# Patient Record
Sex: Male | Born: 1960
Health system: Southern US, Community
[De-identification: ages and names within clinical notes are randomized; demographics above are authoritative.]

## PROBLEM LIST (undated history)

## (undated) DIAGNOSIS — I2510 Atherosclerotic heart disease of native coronary artery without angina pectoris: Secondary | ICD-10-CM

## (undated) DIAGNOSIS — R740 Nonspecific elevation of levels of transaminase and lactic acid dehydrogenase [LDH]: Principal | ICD-10-CM

## (undated) DIAGNOSIS — H02839 Dermatochalasis of unspecified eye, unspecified eyelid: Secondary | ICD-10-CM

## (undated) DIAGNOSIS — K219 Gastro-esophageal reflux disease without esophagitis: Secondary | ICD-10-CM

## (undated) DIAGNOSIS — H43812 Vitreous degeneration, left eye: Secondary | ICD-10-CM

## (undated) DIAGNOSIS — E119 Type 2 diabetes mellitus without complications: Secondary | ICD-10-CM

## (undated) DIAGNOSIS — E785 Hyperlipidemia, unspecified: Secondary | ICD-10-CM

## (undated) DIAGNOSIS — H268 Other specified cataract: Secondary | ICD-10-CM

## (undated) DIAGNOSIS — I219 Acute myocardial infarction, unspecified: Secondary | ICD-10-CM

## (undated) DIAGNOSIS — I10 Essential (primary) hypertension: Secondary | ICD-10-CM

## (undated) DIAGNOSIS — H8110 Benign paroxysmal vertigo, unspecified ear: Secondary | ICD-10-CM

## (undated) DIAGNOSIS — T7840XA Allergy, unspecified, initial encounter: Secondary | ICD-10-CM

## (undated) HISTORY — DX: Nonspecific elevation of levels of transaminase and lactic acid dehydrogenase (ldh): R74.0

## (undated) HISTORY — DX: Benign paroxysmal vertigo, unspecified ear: H81.10

## (undated) HISTORY — DX: Other specified cataract: H26.8

## (undated) HISTORY — DX: Dermatochalasis of unspecified eye, unspecified eyelid: H02.839

## (undated) HISTORY — DX: Gastro-esophageal reflux disease without esophagitis: K21.9

## (undated) HISTORY — DX: Allergy, unspecified, initial encounter: T78.40XA

## (undated) HISTORY — DX: Type 2 diabetes mellitus without complications: E11.9

## (undated) HISTORY — DX: Vitreous degeneration, left eye: H43.812

## (undated) HISTORY — DX: Atherosclerotic heart disease of native coronary artery without angina pectoris: I25.10

---

## 2010-04-29 HISTORY — PX: CORONARY/GRAFT ACUTE MI REVASCULARIZATION: CATH118305

## 2012-08-17 DIAGNOSIS — J309 Allergic rhinitis, unspecified: Secondary | ICD-10-CM | POA: Insufficient documentation

## 2015-05-12 ENCOUNTER — Emergency Department (INDEPENDENT_AMBULATORY_CARE_PROVIDER_SITE_OTHER)
Admission: EM | Admit: 2015-05-12 | Discharge: 2015-05-12 | Disposition: A | Discharge status: Home / Self Care | Payer: Commercial Managed Care - PPO | Source: Home / Self Care | Attending: Family Medicine | Admitting: Family Medicine

## 2015-05-12 ENCOUNTER — Encounter: Payer: Self-pay | Admitting: *Deleted

## 2015-05-12 DIAGNOSIS — K219 Gastro-esophageal reflux disease without esophagitis: Secondary | ICD-10-CM | POA: Diagnosis not present

## 2015-05-12 DIAGNOSIS — R1013 Epigastric pain: Secondary | ICD-10-CM

## 2015-05-12 HISTORY — DX: Acute myocardial infarction, unspecified: I21.9

## 2015-05-12 HISTORY — DX: Essential (primary) hypertension: I10

## 2015-05-12 HISTORY — DX: Hyperlipidemia, unspecified: E78.5

## 2015-05-12 MED ORDER — OMEPRAZOLE 20 MG PO CPDR: 20.0000 mg | DELAYED_RELEASE_CAPSULE | Freq: Two times a day (BID) | ORAL | Status: DC

## 2015-05-12 NOTE — ED Provider Notes (Signed)
CSN: [id number]     Arrival date & time 05/12/15  1949 History   None    Chief Complaint  Patient presents with  . Abdominal Pain  . Nausea  . Chills   (Consider location/radiation/quality/duration/timing/severity/associated sxs/prior Treatment) HPI Pt is a 55yo male presenting to [facility] with c/o epigastric pain with associated nausea that is intermittent for 1 month. 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 This medication use to work but now his abdominal pain worsens by the end of the day.  Pain is a burning sensation.  Nothing seems to make it better or worse.  He does report having had an MI in the past too but states he just f/u with his cardiologist a few weeks ago and had a normal EKG and normal exam. He is scheduled for a stress test in a few weeks.  Denies fever, n/v/d. Denies chest pain or SOB. He has had mild rhinorrhea and chills but otherwise feels okay.   Past Medical History  Diagnosis Date  . Hypertension   . Hyperlipidemia   . Myocardial infarction [facility] [place])    History reviewed. No pertinent past surgical history. Family History  Problem Relation Age of Onset  . Diabetes Mother   . Heart disease Mother   . Kidney disease Mother    Social History  Substance Use Topics  . Smoking status: Never Smoker   . Smokeless tobacco: None  . Alcohol Use: No    Review of Systems  Constitutional: Negative for fever and chills.  HENT: Positive for congestion. Negative for ear pain, sore throat, trouble swallowing and voice change.   Respiratory: Negative for cough, shortness of breath and wheezing.   Cardiovascular: Negative for chest pain and palpitations.  Gastrointestinal: Positive for nausea and abdominal pain. Negative for vomiting and diarrhea.  Musculoskeletal: Negative for myalgias, back pain and arthralgias.  Skin: Negative for rash.    Allergies  Review of patient's allergies indicates no  known allergies.  Home Medications   Prior to Admission medications   Medication Sig Start Date End Date Taking? Authorizing Provider  acetaminophen (TYLENOL) 325 MG tablet Take 650 mg by mouth every 6 (six) hours as needed.   Yes Historical Provider, MD  aspirin 81 MG chewable tablet Chew by mouth daily.   Yes Historical Provider, MD  atorvastatin (LIPITOR) 80 MG tablet Take 80 mg by mouth daily.   Yes Historical Provider, MD  metoprolol tartrate (LOPRESSOR) 25 MG tablet Take 25 mg by mouth 2 (two) times daily.   Yes Historical Provider, MD  MULTIPLE VITAMIN PO Take by mouth.   Yes Historical Provider, MD  nitroGLYCERIN (NITROSTAT) 0.4 MG SL tablet Place 0.4 mg under the tongue every 5 (five) minutes as needed for chest pain.   Yes Historical Provider, MD  omeprazole (PRILOSEC) 20 MG capsule Take 20 mg by mouth daily.   Yes Historical Provider, MD  omeprazole (PRILOSEC) 20 MG capsule Take 1 capsule (20 mg total) by mouth 2 (two) times daily before a meal. 05/12/15   Junius Finner, PA-C   Meds Ordered and Administered this Visit  Medications - No data to display  BP 144/88 mmHg  Pulse 98  Temp(Src) 98.8 F (37.1 C) (Oral)  Resp 20  Ht 5\' 7"  (1.702 m)  Wt 189 lb (85.73 kg)  BMI 29.59 kg/m2  SpO2 98% No data found.   Physical Exam  Constitutional: He appears  well-developed and well-nourished. No distress.  HENT:  Head: Normocephalic and atraumatic.  Eyes: Conjunctivae are normal. No scleral icterus.  Neck: Normal range of motion. Neck supple.  Cardiovascular: Normal rate, regular rhythm and normal heart sounds.   Pulmonary/Chest: Effort normal and breath sounds normal. No respiratory distress. He has no wheezes. He has no rales. He exhibits no tenderness.  Abdominal: Soft. Bowel sounds are normal. He exhibits no distension and no mass. There is tenderness. There is no rebound and no guarding.  Soft, non-distended. Mild tenderness in epigastrium w/o rebound, guarding or masses.    Musculoskeletal: Normal range of motion.  Neurological: He is alert.  Skin: Skin is warm and dry. He is not diaphoretic.  Nursing note and vitals reviewed.   ED Course  Procedures (including critical care time)  Labs Review Labs Reviewed  COMPLETE METABOLIC PANEL WITH GFR  LIPASE  CBC WITH DIFFERENTIAL/PLATELET  POCT CBC W AUTO DIFF ([facility])    Imaging Review No results found.   MDM   1. Abdominal pain, epigastric   2. Gastroesophageal reflux disease, esophagitis presence not specified     Pt with hx of GERD c/o worsening epigastric pain and nausea for 1 month. Symptoms wax and wane.    Pt declined going to emergency department for further workup and declined EKG as he reports having a normal EKG just a few weeks ago with his cardiologist.  Denies chest pain or SOB.   Pt does have reproducible epigastric tenderness. Hx and exam c/w GERD.  Doubt cholecystitis but will get CBC, CMP and Lipase.    Pt advised to start taking his omeprazole twice daily and strongly encouraged to go to emergency department or call 911 if symptoms worsen including chest pain, shortness of breath or vomiting or other new concerning symptoms develop. Pt states he plans to go to [facility], his PCP tomorrow when they open to go over his new dose of medication as he is going to be out of town for work on Sunday. Patient verbalized understanding and agreement with treatment plan.       Junius Finnerrin O'Malley, PA-C 05/12/15 2025

## 2015-05-12 NOTE — ED Notes (Signed)
Pt c/o epigastric pain and nausea x 1 mth. He saw a dr in [facility] [place] and was told to take omeprazole. After taking it for 10 days he reports relief. Pain came back 05/01/15 Omeprazole is not helping. Hx of MI. He also c/o chills and stuffy nose x today.

## 2015-05-13 ENCOUNTER — Other Ambulatory Visit: Payer: Self-pay | Admitting: Emergency Medicine

## 2015-05-14 ENCOUNTER — Telehealth: Payer: Self-pay

## 2015-05-14 LAB — COMPLETE METABOLIC PANEL WITH GFR
ALT: 72 U/L — ABNORMAL HIGH (ref 9–46)
AST: 45 U/L — ABNORMAL HIGH (ref 10–35)
Albumin: 4 g/dL (ref 3.6–5.1)
Alkaline Phosphatase: 90 U/L (ref 40–115)
BUN: 19 mg/dL (ref 7–25)
CO2: 27 mmol/L (ref 20–31)
Calcium: 9.3 mg/dL (ref 8.6–10.3)
Chloride: 103 mmol/L (ref 98–110)
Creat: 0.84 mg/dL (ref 0.70–1.33)
GFR, Est African American: >89 mL/min (ref >=60)
GFR, Est Non African American: >89 mL/min (ref >=60)
Glucose, Bld: 121 mg/dL — ABNORMAL HIGH (ref 65–99)
Potassium: 4 mmol/L (ref 3.5–5.3)
Sodium: 139 mmol/L (ref 135–146)
Total Bilirubin: 0.6 mg/dL (ref 0.2–1.2)
Total Protein: 7.6 g/dL (ref 6.1–8.1)

## 2015-05-14 LAB — LIPASE: Lipase: 45 U/L (ref 7–60)

## 2015-05-22 ENCOUNTER — Ambulatory Visit: Payer: Commercial Managed Care - PPO | Admitting: Family Medicine

## 2015-05-26 ENCOUNTER — Ambulatory Visit (INDEPENDENT_AMBULATORY_CARE_PROVIDER_SITE_OTHER): Payer: Commercial Managed Care - PPO | Admitting: Family Medicine

## 2015-05-26 ENCOUNTER — Encounter: Payer: Self-pay | Admitting: Family Medicine

## 2015-05-26 VITALS — BP 106/68 | HR 78 | Ht 67.32 in | Wt 184.0 lb

## 2015-05-26 DIAGNOSIS — K2970 Gastritis, unspecified, without bleeding: Secondary | ICD-10-CM | POA: Diagnosis not present

## 2015-05-26 DIAGNOSIS — R748 Abnormal levels of other serum enzymes: Secondary | ICD-10-CM | POA: Diagnosis not present

## 2015-05-26 DIAGNOSIS — K2990 Gastroduodenitis, unspecified, without bleeding: Secondary | ICD-10-CM

## 2015-05-26 DIAGNOSIS — E785 Hyperlipidemia, unspecified: Secondary | ICD-10-CM | POA: Diagnosis not present

## 2015-05-26 DIAGNOSIS — I252 Old myocardial infarction: Secondary | ICD-10-CM

## 2015-05-26 MED ORDER — PANTOPRAZOLE SODIUM 40 MG PO TBEC: 40.0000 mg | DELAYED_RELEASE_TABLET | Freq: Every day | ORAL | Status: DC

## 2015-05-26 NOTE — Progress Notes (Signed)
CC: Zachary Harvey is a 55 y.o. male is here for Establish Care; Chest Pain; and Results   Subjective: HPI:   very pleasant 55 year old here to establish care.   He has a history of a myocardial infarction,  This occurred 2 years ago and he's been getting adequate follow-up with [facility]. In fact he had a stress test this morning which was normal and reassuring. He takes an aspirin, statin and beta blocker.   he has a history of hyperlipidemia  Currently taking max dose of  Atorvastatin. Denies right upper quadrant pain or myalgias   He had blood work done last week showing a mild elevation in liver function test. Additionally a mild elevation in blood sugar.   His major complaint today is epigastric pain that's been present for the past 2 or 3 weeks. It was improving with omeprazole however starting to worsen and he was seen next door at urgent care.  Lipase was normal. He had diarrhea the day after he was at urgent care but is back to having daily solid bowel movements. He denies any nausea or vomiting. Pain is worse when drinking soda or anything with acid. It's also been  Joined with the sensation of reflux up the back of his esophagus.  There is no exertional component to his discomfort. It's nonradiating.he had similar pain over 10 years ago that last a matter of months and required a colonoscopy and EGD both which were normal.  Review of Systems - General ROS: negative for - chills, fever, night sweats, weight gain or weight loss Ophthalmic ROS: negative for - decreased vision Psychological ROS: negative for - anxiety or depression ENT ROS: negative for - hearing change, nasal congestion, tinnitus or allergies Hematological and Lymphatic ROS: negative for - bleeding problems, bruising or swollen lymph nodes Breast ROS: negative Respiratory ROS: no cough, shortness of breath, or wheezing Cardiovascular ROS: no chest pain or dyspnea on exertion Gastrointestinal ROS:  no  change in bowel habits, or black or bloody stools Genito-Urinary ROS: negative for - genital discharge, genital ulcers, incontinence or abnormal bleeding from genitals Musculoskeletal ROS: negative for - joint pain or muscle pain Neurological ROS: negative for - headaches or memory loss Dermatological ROS: negative for lumps, mole changes, rash and skin lesion changes  Past Medical History  Diagnosis Date  . Hypertension   . Hyperlipidemia   . Myocardial infarction (HCC)     No past surgical history on file. Family History  Problem Relation Age of Onset  . Diabetes Mother   . Heart disease Mother   . Kidney disease Mother     Social History   Social History  . Marital Status: Married    Spouse Name: N/A  . Number of Children: N/A  . Years of Education: N/A   Occupational History  . Not on file.   Social History Main Topics  . Smoking status: Never Smoker   . Smokeless tobacco: Never Used  . Alcohol Use: No  . Drug Use: No  . Sexual Activity: Yes    Birth Control/ Protection: None   Other Topics Concern  . Not on file   Social History Narrative     Objective: BP 106/68 mmHg  Pulse 78  Ht 5' 7.32" (1.71 m)  Wt 184 lb (83.462 kg)  BMI 28.54 kg/m2  SpO2 94%  General: Alert and Oriented, No Acute Distress HEENT: Pupils equal, round, reactive to light. Conjunctivae clear. Moist mucous membranes Lungs: Clear to  auscultation bilaterally, no wheezing/ronchi/rales.  Comfortable work of breathing. Good air movement. Cardiac: Regular rate and rhythm. Normal S1/S2.  No murmurs, rubs, nor gallops.   Abdomen: mild obesity, nontender Extremities: No peripheral edema.  Strong peripheral pulses.  Mental Status: No depression, anxiety, nor agitation. Skin: Warm and dry.  Assessment & Plan: Antonyo was seen today for establish care, chest pain and results.  Diagnoses and all orders for this visit:  History of MI (myocardial infarction)  Hyperlipidemia  Elevated  liver enzymes  Gastritis and gastroduodenitis -     pantoprazole (PROTONIX) 40 MG tablet; Take 1 tablet (40 mg total) by mouth daily. To help prevent stomach pain.   Coronary artery disease with a recent normal stress test and adequate medical treatment with a statin, beta blocker and aspirin. Gastritis: chronic uncontrolled condition,Stop omeprazole switching to Protonix. If no better after 1 week call. Elevated liver enzymes: Plan on follow-up in 4 weeks to talk about repeating liver enzymes versus abdominal ultrasound.  Return in about 4 weeks (around 06/23/2015) for Stomach Pain.

## 2015-06-06 ENCOUNTER — Encounter: Payer: Self-pay | Admitting: Family Medicine

## 2015-06-06 ENCOUNTER — Ambulatory Visit (INDEPENDENT_AMBULATORY_CARE_PROVIDER_SITE_OTHER): Payer: Commercial Managed Care - PPO | Admitting: Family Medicine

## 2015-06-06 VITALS — BP 124/79 | HR 74 | Wt 185.0 lb

## 2015-06-06 DIAGNOSIS — H8112 Benign paroxysmal vertigo, left ear: Secondary | ICD-10-CM | POA: Diagnosis not present

## 2015-06-06 DIAGNOSIS — R42 Dizziness and giddiness: Secondary | ICD-10-CM | POA: Diagnosis not present

## 2015-06-06 DIAGNOSIS — R05 Cough: Secondary | ICD-10-CM | POA: Diagnosis not present

## 2015-06-06 DIAGNOSIS — R059 Cough, unspecified: Secondary | ICD-10-CM

## 2015-06-06 MED ORDER — AZITHROMYCIN 250 MG PO TABS: ORAL_TABLET | ORAL | Status: AC

## 2015-06-06 MED ORDER — DIAZEPAM 5 MG PO TABS: 2.5000 mg | ORAL_TABLET | Freq: Two times a day (BID) | ORAL | Status: DC | PRN

## 2015-06-06 NOTE — Progress Notes (Signed)
CC: Zachary Harvey is a 55 y.o. male is here for Hospitalization Follow-up   Subjective: HPI:  On Sunday this past week he started to develop a sensation of dizziness if he turns his head to the left or lies down suddenly. It was a 10 out of 10 on Sunday and has slowly reduced to a 5 out of 5 with any particular intervention. At rest when he stationary is nonexistent. No benefit from meclizine provided by urgent care on Sunday. He denies any hearing loss or tinnitus. Denies any head trauma or headache. He also developed a nonproductive cough and subjective chills on Sunday which is only mildly improved with over-the-counter cough and cold medication. He denies fever. He denies nausea or decreased appetite. Denies vision disturbance or any motor or sensory disturbances other than that described above. He denies any chest pain or shortness of breath.   Review Of Systems Outlined In HPI  Past Medical History  Diagnosis Date  . Hypertension   . Hyperlipidemia   . Myocardial infarction (HCC)     No past surgical history on file. Family History  Problem Relation Age of Onset  . Diabetes Mother   . Heart disease Mother   . Kidney disease Mother     Social History   Social History  . Marital Status: Married    Spouse Name: N/A  . Number of Children: N/A  . Years of Education: N/A   Occupational History  . Not on file.   Social History Main Topics  . Smoking status: Never Smoker   . Smokeless tobacco: Never Used  . Alcohol Use: No  . Drug Use: No  . Sexual Activity: Yes    Birth Control/ Protection: None   Other Topics Concern  . Not on file   Social History Narrative     Objective: BP 124/79 mmHg  Pulse 74  Wt 185 lb (83.915 kg)  General: Alert and Oriented, No Acute Distress HEENT: Pupils equal, round, reactive to light. Conjunctivae clear.  External ears unremarkable, canals clear with intact TMs with appropriate landmarks.  Middle ear appears open without effusion.  Pink inferior turbinates.  Moist mucous membranes, pharynx without inflammation nor lesions.  Neck supple without palpable lymphadenopathy nor abnormal masses. Lungs: Clear to auscultation bilaterally, no wheezing/ronchi/rales.  Comfortable work of breathing. Good air movement. Neuro: CN II-XII grossly intact, full strength/rom of all four extremities, C5/L4/S1 DTRs 2/4 bilaterally, gait normal, rapid alternating movements normal, heel-shin test normal, Rhomberg normal. No nystagmus. Symptoms reproduced with Dix-Hallpike to the left Cardiac: Regular rate and rhythm. Normal S1/S2.  No murmurs, rubs, nor gallops.   Extremities: No peripheral edema.  Strong peripheral pulses.  Mental Status: No depression, anxiety, nor agitation. Skin: Warm and dry.  Assessment & Plan: Zachary Harvey was seen today for hospitalization follow-up.  Diagnoses and all orders for this visit:  Dizziness  Cough -     azithromycin (ZITHROMAX) 250 MG tablet; Take two tabs at once on day 1, then one tab daily on days 2-5.  BPV (benign positional vertigo), left -     diazepam (VALIUM) 5 MG tablet; Take 0.5 tablets (2.5 mg total) by mouth every 12 (twelve) hours as needed (to prevent dizziness).   Classic symptoms of BPPV with no red flags. He is worried that he doesn't have the confidence to do the modified Epley maneuver due to fear of dizziness. I've given him a small dose of diazepam to use it once he starts to feel the sedative  effect he should start using the modified Epley maneuver under the guidance of his wife up to 3 times a day. Signs and symptoms requring emergent/urgent reevaluation were discussed with the patient. His cough is due to a respiratory illness that is most likely unrelated to his dizziness, low suspicion of intracranial abnormality nor vestibular neuritis given reproducibility of his symptoms   Return if symptoms worsen or fail to improve.

## 2015-06-12 ENCOUNTER — Ambulatory Visit (INDEPENDENT_AMBULATORY_CARE_PROVIDER_SITE_OTHER): Payer: Commercial Managed Care - PPO | Admitting: Family Medicine

## 2015-06-12 ENCOUNTER — Encounter: Payer: Self-pay | Admitting: Family Medicine

## 2015-06-12 VITALS — BP 129/80 | HR 69 | Wt 183.0 lb

## 2015-06-12 DIAGNOSIS — H538 Other visual disturbances: Secondary | ICD-10-CM | POA: Diagnosis not present

## 2015-06-12 DIAGNOSIS — H8112 Benign paroxysmal vertigo, left ear: Secondary | ICD-10-CM | POA: Diagnosis not present

## 2015-06-12 DIAGNOSIS — R42 Dizziness and giddiness: Secondary | ICD-10-CM | POA: Insufficient documentation

## 2015-06-12 DIAGNOSIS — H8110 Benign paroxysmal vertigo, unspecified ear: Secondary | ICD-10-CM | POA: Insufficient documentation

## 2015-06-12 NOTE — Progress Notes (Signed)
CC: Zachary Harvey is a 55 y.o. male is here for Work Release   Subjective: HPI:   Follow-up dizziness: He only ended up taking a few doses of Valium. He said it was helping with dizziness. He also noticed that the modified Epley maneuver made a big improvement with his dizziness. He's no longer having any lightheadedness or dizziness. Denies any other motor or sensory disturbances other than that described below.Marland Kitchen  He is wondering if he needs to go to classes. He occasionally noticed that he has some blurry vision bilaterally. He denies any ocular pain or discharge. He denies any itching of the eyes. Denies any tearing.    Review Of Systems Outlined In HPI  Past Medical History  Diagnosis Date  . Hypertension   . Hyperlipidemia   . Myocardial infarction (HCC)     No past surgical history on file. Family History  Problem Relation Age of Onset  . Diabetes Mother   . Heart disease Mother   . Kidney disease Mother     Social History   Social History  . Marital Status: Married    Spouse Name: N/A  . Number of Children: N/A  . Years of Education: N/A   Occupational History  . Not on file.   Social History Main Topics  . Smoking status: Never Smoker   . Smokeless tobacco: Never Used  . Alcohol Use: No  . Drug Use: No  . Sexual Activity: Yes    Birth Control/ Protection: None   Other Topics Concern  . Not on file   Social History Narrative     Objective: BP 129/80 mmHg  Pulse 69  Wt 183 lb (83.008 kg)  General: Alert and Oriented, No Acute Distress HEENT: Pupils equal, round, reactive to light. Conjunctivae clear.  Moist mucous membranes Lungs: Clear to auscultation bilaterally, no wheezing/ronchi/rales.  Comfortable work of breathing. Good air movement. Cardiac: Regular rate and rhythm. Normal S1/S2.  No murmurs, rubs, nor gallops.   Neuro: CN II-XII grossly intact, full strength/rom of all four extremities, C5/L4/S1 DTRs 2/4 bilaterally, gait normal, rapid  alternating movements normal, heel-shin test normal, Rhomberg normal. Extremities: No peripheral edema.  Strong peripheral pulses.  Mental Status: No depression, anxiety, nor agitation. Skin: Warm and dry.  Assessment & Plan: Zachary Harvey was seen today for work release.  Diagnoses and all orders for this visit:  BPV (benign positional vertigo), left  Blurred vision   BPV has now fully resolved,  I provided him with a letter today allowing him to go back to work without restrictions. Blurred vision: Vision by Snellen chart is reassuring, discussed following up with his ophthalmologist nonurgently.  Return if symptoms worsen or fail to improve.

## 2015-06-26 ENCOUNTER — Encounter: Payer: Self-pay | Admitting: Family Medicine

## 2015-06-26 DIAGNOSIS — H905 Unspecified sensorineural hearing loss: Secondary | ICD-10-CM | POA: Insufficient documentation

## 2015-06-26 DIAGNOSIS — H903 Sensorineural hearing loss, bilateral: Secondary | ICD-10-CM | POA: Insufficient documentation

## 2015-07-03 ENCOUNTER — Ambulatory Visit (INDEPENDENT_AMBULATORY_CARE_PROVIDER_SITE_OTHER): Payer: Commercial Managed Care - PPO | Admitting: Family Medicine

## 2015-07-03 ENCOUNTER — Encounter: Payer: Self-pay | Admitting: Family Medicine

## 2015-07-03 VITALS — BP 128/80 | HR 72 | Wt 186.0 lb

## 2015-07-03 DIAGNOSIS — R42 Dizziness and giddiness: Secondary | ICD-10-CM | POA: Diagnosis not present

## 2015-07-03 MED ORDER — AMOXICILLIN-POT CLAVULANATE 500-125 MG PO TABS: ORAL_TABLET | ORAL | Status: DC

## 2015-07-03 NOTE — Progress Notes (Signed)
CC: Zachary Harvey is a 55 y.o. male is here for Dizziness and Nausea   Subjective: HPI:   dizziness and nauseousness when leaning forward or lying down and looking to the right. Symptoms have been present for the past 2 days. There  Not getting any better or worse. No interventions as of yet. He denies any dizziness and any other situation. There is no fevers, chills nor vomiting. He denies any other sensory or motor disturbances.  He had  2 separate incidents of severe head trauma when he was younger but nothing recently. He denies any headache but does have some facial pressure behind the right eye in the forehead and nasal congestion that's been present for at least a week now.   Review Of Systems Outlined In HPI  Past Medical History  Diagnosis Date  . Hypertension   . Hyperlipidemia   . Myocardial infarction (HCC)     No past surgical history on file. Family History  Problem Relation Age of Onset  . Diabetes Mother   . Heart disease Mother   . Kidney disease Mother     Social History   Social History  . Marital Status: Married    Spouse Name: N/A  . Number of Children: N/A  . Years of Education: N/A   Occupational History  . Not on file.   Social History Main Topics  . Smoking status: Never Smoker   . Smokeless tobacco: Never Used  . Alcohol Use: No  . Drug Use: No  . Sexual Activity: Yes    Birth Control/ Protection: None   Other Topics Concern  . Not on file   Social History Narrative     Objective: BP 128/80 mmHg  Pulse 72  Wt 186 lb (84.369 kg)  General: Alert and Oriented, No Acute Distress HEENT: Pupils equal, round, reactive to light. Conjunctivae clear.  External ears unremarkable, canals clear with intact TMs with appropriate landmarks.   Left Middle ear appears open without effusion,  Right middle ear has a moderate serous effusion. Pink inferior turbinates.  Moist mucous membranes, pharynx without inflammation nor lesions.  Neck supple without  palpable lymphadenopathy nor abnormal masses. Lungs: Clear to auscultation bilaterally, no wheezing/ronchi/rales.  Comfortable work of breathing. Good air movement. Cardiac: Regular rate and rhythm. Normal S1/S2.  No murmurs, rubs, nor gallops.   No carotid bruit Extremities: No peripheral edema.  Strong peripheral pulses.  Mental Status: No depression, anxiety, nor agitation. Skin: Warm and dry.  Assessment & Plan: Zachary Harvey was seen today for dizziness and nausea.  Diagnoses and all orders for this visit:  Dizziness -     Ambulatory referral to Neurology -     amoxicillin-clavulanate (AUGMENTIN) 500-125 MG tablet; Take one by mouth every 8 hours for ten total days.    most like BPPV to the right therefore start  Modified Epley maneuver which  He already has information on.  Still possible this could be due to a sinus infection therefore start Augmentin as well. He would like reassurance that this is not something more significant than either of the above  Explanations  And would like a referral to a neurologist which seems reasonable.  No Follow-up on file.

## 2015-07-06 ENCOUNTER — Ambulatory Visit (INDEPENDENT_AMBULATORY_CARE_PROVIDER_SITE_OTHER): Payer: Commercial Managed Care - PPO | Admitting: Neurology

## 2015-07-06 ENCOUNTER — Encounter: Payer: Self-pay | Admitting: Neurology

## 2015-07-06 VITALS — BP 138/86 | HR 77 | Ht 67.0 in | Wt 188.4 lb

## 2015-07-06 DIAGNOSIS — H8113 Benign paroxysmal vertigo, bilateral: Secondary | ICD-10-CM | POA: Diagnosis not present

## 2015-07-06 NOTE — Patient Instructions (Signed)
-   will refer you to physical therapy to teach you maneuvers - you need to do it by yourself at home 5 times each and twice a day for one month. 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

## 2015-07-07 ENCOUNTER — Ambulatory Visit: Payer: Commercial Managed Care - PPO | Attending: Neurology | Admitting: Physical Therapy

## 2015-07-07 DIAGNOSIS — H8111 Benign paroxysmal vertigo, right ear: Secondary | ICD-10-CM | POA: Diagnosis present

## 2015-07-07 NOTE — Progress Notes (Signed)
NEUROLOGY CLINIC NEW PATIENT NOTE  NAME: Zachary Harvey DOB: 1960-11-28 REFERRING PHYSICIAN: Laren BoomHommel, Sean, DO  I saw Zachary Harvey as a new consult in the neurovascular clinic today regarding  Chief Complaint  Patient presents with  . New Patient (Initial Visit)    referral from  Dr. Laren BoomSean Hommel pts PCP for dizzy spells  .  HPI: Zachary Harvey is a 55 y.o. male with PMH of MI s/p stent 4 years ago and GERD who presents as a new patient for vertigo.   Pt stated that about 3 years ago, he turned over in bed to his right side, had episode of room spinning, lasting 15-2520min, he sat up taking Nitro for fearing of MI, called 911. On EMS arrival, he felt better, in ED had EKG negative, he was discharged from ED.   One month ago, he woke up and got up, suddenly had vertigo again, sever, with diarrhea, nausea but no vomiting. Real vertigo phase was brief but the nasuea and discomfort lasted whole day. He went to see PCP Dr. Gregary SignsSean and was diagnosed with BPPV and watched video for exercise maneuver. His condition getting much better.   Earlier this week, he got up in the morning, again had vertigo. Went to see PCP and referred her for further evaluation.  Pt has PMH of MI 4 years ago, s/p stent, on Nitro PRN and metoprolol, ASA and lipitor. He also has GERD on protonix. 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   He is a long distance truck driver, denies smoking, alcohol or illcit drugs.   Past Medical History  Diagnosis Date  . Hypertension   . Hyperlipidemia   . Myocardial infarction [facility](HCC)    History reviewed. No pertinent past surgical history. Family History  Problem Relation Age of Onset  . Diabetes Mother   . Heart disease Mother   . Kidney disease Mother    Current Outpatient Prescriptions  Medication Sig Dispense Refill  . acetaminophen (TYLENOL) 325 MG tablet Take 650 mg by mouth every 6 (six) hours as needed.      Marland Kitchen. aspirin 325 MG tablet Take 325 mg by mouth daily.    Marland Kitchen. atorvastatin (LIPITOR) 80 MG tablet Take 80 mg by mouth daily.    . Chlorphen-Pseudoephed-APAP (CORICIDIN D PO) Take by mouth.    . fluocinolone (SYNALAR) 0.01 % external solution Apply topically 2 (two) times daily. Takes 5 drops in right ear    . metoprolol tartrate (LOPRESSOR) 25 MG tablet Take 25 mg by mouth 2 (two) times daily.    . MULTIPLE VITAMIN PO Take by mouth.    . nitroGLYCERIN (NITROSTAT) 0.4 MG SL tablet Place 0.4 mg under the tongue every 5 (five) minutes as needed for chest pain.    . pantoprazole (PROTONIX) 40 MG tablet Take 1 tablet (40 mg total) by mouth daily. To help prevent stomach pain. 30 tablet 3   No current facility-administered medications for this visit.   No Known Allergies Social History   Social History  . Marital Status: Married    Spouse Name: N/A  . Number of Children: N/A  . Years of Education: N/A   Occupational History  . Not on file.   Social History Main Topics  . Smoking status: Never Smoker   . Smokeless tobacco: Never Used  . Alcohol Use: No  . Drug Use: No  . Sexual Activity: Yes    Birth Control/  Protection: None   Other Topics Concern  . Not on file   Social History Narrative    Review of Systems Full 14 system review of systems performed and notable only for those listed, all others are neg:  Constitutional:  fatigue Cardiovascular:  Ear/Nose/Throat:  Ringing in ears, spinning sensation Skin:  Eyes:  Blurry vision, eye pain Respiratory:   Gastroitestinal:  constipation Genitourinary:  Hematology/Lymphatic:   Endocrine:  Musculoskeletal:  Joint pain, aching muscles Allergy/Immunology:  allergies Neurological:  HA, weakness, dizziness Psychiatric: too much sleep Sleep:    Physical Exam  Filed Vitals:   07/06/15 0832  BP: 138/86  Pulse: 77    General - Well nourished, well developed, in no apparent distress.  Ophthalmologic - Sharp disc margins  OU.  Cardiovascular - Regular rate and rhythm with no murmur. Carotid pulses were 2+ without bruits .   Neck - supple, no nuchal rigidity.  Mental Status -  Level of arousal and orientation to time, place, and person were intact. Language including expression, naming, repetition, comprehension, reading, and writing was assessed and found intact. Attention span and concentration were normal. Fund of Knowledge was assessed and was intact.  Cranial Nerves II - XII - II - Visual field intact OU. III, IV, VI - Extraocular movements intact. V - Facial sensation intact bilaterally. VII - Facial movement intact bilaterally. VIII - Hearing & vestibular intact bilaterally. X - Palate elevates symmetrically. XI - Chin turning & shoulder shrug intact bilaterally. XII - Tongue protrusion intact.  Motor Strength - The patient's strength was normal in all extremities and pronator drift was absent.  Bulk was normal and fasciculations were absent.   Motor Tone - Muscle tone was assessed at the neck and appendages and was normal.  Reflexes - The patient's reflexes were normal in all extremities and he had no pathological reflexes.  Sensory - Light touch, temperature/pinprick were assessed and were normal.    Coordination - The patient had normal movements in the hands and feet with no ataxia or dysmetria.  Tremor was absent.  Gait and Station - The patient's transfers, posture, gait, station, and turns were observed as normal.  Positive Dix-Hallpike test - bilateral horizontal and right posterior canal involvement.    Imaging  I have personally reviewed the radiological images below and agree with the radiology interpretations.  none  Lab Review none    Assessment and Plan:   In summary, Zachary Harvey is a 55 y.o. male with PMH of MI s/p stent 4 years ago and GERD presents as new pt with intermittent vertigo on positional changes. Pt symptoms and exam consistent with BPPV at horizontal  and posterior canal level. He needs PT referral for maneuver training and constant self exercise at home. Although he had MI 4 years ago, he may have atherosclerosis with brain vasculature, but lack of central neurological deficit, will not need MRI or MRA at this time.   He is a long distance truck driver, however, the episode almost exclusively associated with head positional change on getting up from bed, or lying down onto bed or turning over in bed, we feel that he is still able to continue his driving but recommend limit head movement as he can. Of course, he should stop driving if feeling of dizziness occurs which he is aware. I also recommend that if he can change career or limited driving locally, he will think about it.   - urgent PT referral for maneuver training - you  maneuver exercise 5-10 times for one month at least. If vertigo recurs, need to do maneuver again for one month - follow up with your PCP - OK to continue driving with limiting head movement as possible - follow up in 1-2 months.  Thank you very much for the opportunity to participate in the care of this patient.  Please do not hesitate to call if any questions or concerns arise.  I spent more than 45 minutes of face to face time with the patient. Greater than 50% of time was spent in counseling and coordination of care. I taught him about maneuvers, watched video of maneuver training, and discussed about PT referral and safety issues.    Orders Placed This Encounter  Procedures  . Ambulatory referral to Physical Therapy    Referral Type:  Physical Medicine    Referral Reason:  Specialty Services Required    Requested Specialty:  Physical Therapy    Number of Visits Requested:  1    Meds ordered this encounter  Medications  . aspirin 325 MG tablet    Sig: Take 325 mg by mouth daily.  . fluocinolone (SYNALAR) 0.01 % external solution    Sig: Apply topically 2 (two) times daily. Takes 5 drops in right ear  .  Chlorphen-Pseudoephed-APAP (CORICIDIN D PO)    Sig: Take by mouth.    Patient Instructions  - will refer you to physical therapy to teach you maneuvers - you need to do it by yourself at home 5 times each and twice a day for one month. 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   Marvel Plan, MD PhD [facility] [address], [address] [place], [place] [address] [phone]

## 2015-07-07 NOTE — Therapy (Signed)
[facility] [address] [place], [place], [address] Phone: [phone]   Fax:  [phone]  Physical Therapy Evaluation  Patient Details  Name: Zachary Harvey MRN: [id number] Date of Birth: [birth year] Referring Provider: Marvel Plan  Encounter Date: 07/07/2015      PT End of Session - 07/07/15 1145    Visit Number 1   Number of Visits 4   Date for PT Re-Evaluation 08/04/15   PT Start Time 1100   PT Stop Time 1140   PT Time Calculation (min) 40 min   Activity Tolerance Patient tolerated treatment well   Behavior During Therapy [facility] for tasks assessed/performed      Past Medical History  Diagnosis Date  . Hypertension   . Hyperlipidemia   . Myocardial infarction (HCC)     No past surgical history on file.  There were no vitals filed for this visit.  Visit Diagnosis:  BPPV (benign paroxysmal positional vertigo), right - Plan: PT plan of care cert/re-cert      Subjective Assessment - 07/07/15 1103    Subjective Pt is a 55 y/o male who presents to OPPT with recent onset of vertigo.  Pt reports had one episode a few years ago.  Pt states 1 month hx of ne onest of vertigo; and symptoms especially in AM.  Went to MD and MD instructed pt to "look up videos on you tube."  Pt states this helped some but it has come back.     Patient Stated Goals improve dizziness   Currently in Pain? No/denies            [facility] PT Assessment - 07/07/15 1106    Assessment   Medical Diagnosis BPPV   Referring Provider XU, JINDONG   Onset Date/Surgical Date --  1 month   Next MD Visit 2 months   Prior Therapy none   Precautions   Precautions None   Restrictions   Weight Bearing Restrictions No   Balance Screen   Has the patient fallen in the past 6 months No   Has the patient had a decrease in activity level because of a fear of falling?  No   Is the patient reluctant to leave their home because of a fear of falling?  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            Vestibular Assessment - 07/07/15 1110    Symptom Behavior   Type of Dizziness Spinning   Frequency of Dizziness lying down and rolling over   Duration of Dizziness seconds   Aggravating Factors Lying supine;Rolling to right;Rolling to left;Turning body quickly;Turning head quickly   Relieving Factors Head stationary;Lying supine   Occulomotor Exam   Occulomotor Alignment Normal   Spontaneous Absent   Gaze-induced Absent   Smooth Pursuits Intact   Saccades Intact   Comment head thrust WNL   Vestibulo-Occular Reflex   VOR 1 Head Only (x 1 viewing) WNL   Positional Testing   Dix-Hallpike Dix-Hallpike Right;Dix-Hallpike Left   Horizontal Canal Testing Horizontal Canal Right;Horizontal Canal Left   Dix-Hallpike Right   Dix-Hallpike Right Duration 15 sec   Dix-Hallpike Right Symptoms Upbeat, right rotatory nystagmus   Dix-Hallpike Left   Dix-Hallpike Left Duration none   Dix-Hallpike Left Symptoms No nystagmus   Horizontal Canal Right  Horizontal Canal Right Duration 10-15 sec   Horizontal Canal Right Symptoms Nystagmus;Other (comment)  Right torsional upbeating   Horizontal Canal Left   Horizontal Canal Left Duration 10-15 seconds   Horizontal Canal Left Symptoms Other (comment);Nystagmus  unable to fully identify if nystagmus present and direction                Vestibular Treatment/Exercise - 07/07/15 1143    Vestibular Treatment/Exercise   Vestibular Treatment Provided Canalith Repositioning   Canalith Repositioning Epley Manuever Right   Habituation Exercises Horizontal Roll    EPLEY MANUEVER RIGHT   Number of Reps  2   Overall Response Symptoms Resolved   Horizontal Roll   Symptom Description  instructed in horizontal roll for HEP                PT Education - 07/07/15 1144    Education provided Yes   Education Details BPPV; HEP   Person(s) Educated Patient;Child(ren)   Methods Explanation;Demonstration;Handout   Comprehension Verbalized understanding;Returned demonstration;Need further instruction             PT Long Term Goals - 07/07/15 1148    PT LONG TERM GOAL #1   Title independent with HEP (08/04/15)   Time 4   Period Weeks   Status New   PT LONG TERM GOAL #2   Title demonstrate negative positional testing (08/04/15)   Time 4   Period Weeks   Status New               Plan - 07/07/15 1145    Clinical Impression Statement Pt is a 55 y/o male who presents to OPPT for low complexity evaluation for R posterior canal BPPV with possible horizontal canal involvement.  Pt demonstrates some symptoms and nystagmus with horizontal roll but feel nystagmus is torsional indication it may still only be isolated to R posterior canal.  Issued rolling for habituation for HEP and instructed to perform.  Pt verbalized understanding.  Pt will benefit from PT to decrease dizziness and return to full work activities.   Pt will benefit from skilled therapeutic intervention in order to improve on the following deficits Dizziness   Rehab Potential Good   PT Frequency 1x / week   PT Duration 4 weeks   PT Treatment/Interventions Canalith Repostioning;Therapeutic activities;Therapeutic exercise;Balance training;Neuromuscular re-education;Patient/family education;ADLs/Self Care Home Management;Vestibular   PT Next Visit Plan reassess; tx as indicated   Consulted and Agree with Plan of Care Patient         Problem List Patient Active Problem List   Diagnosis Date Noted  . SNHL (sensory-neural hearing loss), asymmetrical 06/26/2015  . BPV (benign positional vertigo) 06/12/2015  . History of MI (myocardial infarction) 05/26/2015  . Hyperlipidemia 05/26/2015  . Elevated liver enzymes 05/26/2015   Clarita CraneStephanie F Nilam Quakenbush, PT,  DPT 07/07/2015 11:51 AM  [facility] [address] [place], [place], [address] Phone: [phone]   Fax:  [phone]  Name: Leata MouseJose L Jake MRN: [id number] Date of Birth: 1960-11-22

## 2015-07-07 NOTE — Patient Instructions (Signed)
Sit to Side-Lying    Sit on edge of bed. 1. Turn head 45 to right. 2. Maintain head position and lie down quickly on left side. Hold until symptoms subside plus 30 seconds. 3. Sit up slowly. Hold until symptoms subside plus 30 seconds. 4. Turn head 45 to left. 5. Maintain head position and lie down quickly on right side. Hold until symptoms subside plus 30 seconds. 6. Sit up slowly. Repeat sequence __3-5__ times per session. Do _2-3___ sessions per day.  Copyright  VHI. All rights reserved.   Rolling    With pillow under head, start on back. Roll quickly to right. Hold position until symptoms subside plus 30 seconds.  Roll quickly onto left side. Hold position until symptoms subside plus 30 seconds. Repeat sequence __3-5__ times per session. Do __2-3__ sessions per day.  Copyright  VHI. All rights reserved.

## 2015-07-10 ENCOUNTER — Ambulatory Visit: Payer: Commercial Managed Care - PPO | Admitting: Physical Therapy

## 2015-07-10 ENCOUNTER — Encounter: Payer: Self-pay | Admitting: Family Medicine

## 2015-07-10 ENCOUNTER — Ambulatory Visit (INDEPENDENT_AMBULATORY_CARE_PROVIDER_SITE_OTHER): Payer: Commercial Managed Care - PPO | Admitting: Family Medicine

## 2015-07-10 VITALS — BP 128/85 | HR 71 | Wt 188.0 lb

## 2015-07-10 DIAGNOSIS — H8111 Benign paroxysmal vertigo, right ear: Secondary | ICD-10-CM | POA: Diagnosis not present

## 2015-07-10 DIAGNOSIS — K219 Gastro-esophageal reflux disease without esophagitis: Secondary | ICD-10-CM | POA: Diagnosis not present

## 2015-07-10 DIAGNOSIS — H8113 Benign paroxysmal vertigo, bilateral: Secondary | ICD-10-CM

## 2015-07-10 MED ORDER — SUCRALFATE 1 G PO TABS: 1.0000 g | ORAL_TABLET | Freq: Four times a day (QID) | ORAL | Status: DC | PRN

## 2015-07-10 NOTE — Therapy (Signed)
[facility] [address] [place], [place], [address] Phone: [phone]   Fax:  [phone]  Physical Therapy Treatment  Patient Details  Name: Zachary Harvey MRN: [id number] Date of Birth: [birth year] Referring Provider: Marvel Plan  Encounter Date: 07/10/2015      PT End of Session - 07/10/15 0919    Visit Number 2   Number of Visits 4   Date for PT Re-Evaluation 08/04/15   PT Start Time 0830   PT Stop Time 0859   PT Time Calculation (min) 29 min   Activity Tolerance Patient tolerated treatment well   Behavior During Therapy [facility] for tasks assessed/performed      Past Medical History  Diagnosis Date  . Hypertension   . Hyperlipidemia   . Myocardial infarction (HCC)     No past surgical history on file.  There were no vitals filed for this visit.  Visit Diagnosis:  BPPV (benign paroxysmal positional vertigo), right      Subjective Assessment - 07/10/15 0831    Subjective Felt good over the weekend; reports a little spinning this morning.  Only got 4 hours of sleep.   Patient Stated Goals improve dizziness   Currently in Pain? No/denies                Vestibular Assessment - 07/10/15 0832    Symptom Behavior   Type of Dizziness Spinning   Positional Testing   Dix-Hallpike Dix-Hallpike Right;Dix-Hallpike Left   Horizontal Canal Testing Horizontal Canal Right;Horizontal Canal Left   Dix-Hallpike Right   Dix-Hallpike Right Duration 15 sec   Dix-Hallpike Right Symptoms Upbeat, right rotatory nystagmus   Dix-Hallpike Left   Dix-Hallpike Left Duration none   Dix-Hallpike Left Symptoms No nystagmus   Horizontal Canal Right   Horizontal Canal Right Duration 5 sec   Horizontal Canal Right Symptoms Nystagmus;Other (comment)  R torsional upbeating   Horizontal Canal Left   Horizontal Canal Left Duration 3-5 seconds   Horizontal Canal Left Symptoms Other (comment);Nystagmus  L torsional                   Vestibular Treatment/Exercise - 07/10/15 0918    Vestibular Treatment/Exercise   Vestibular Treatment Provided Canalith Repositioning   Canalith Repositioning Epley Manuever Right;Canal Roll Right    EPLEY MANUEVER RIGHT   Number of Reps  2   Overall Response Symptoms Resolved   Response Details  head shaking performed before 2nd rep; no nystagmus   Canal Roll Right   Number of Reps  2   Overall Response  Symptoms Resolved   Response Details  no symptoms on 2nd rep               PT Education - 07/10/15 0920    Education provided Yes   Education Details continue HEP; discussed anatomy of canals clinical findings   Person(s) Educated Patient   Methods Explanation   Comprehension Verbalized understanding             PT Long Term Goals - 07/10/15 0922    PT LONG TERM GOAL #1   Title independent with HEP (08/04/15)   Status On-going   PT LONG TERM GOAL #2   Title demonstrate negative positional testing (08/04/15)   Status On-going               Plan - 07/10/15 0920    Clinical Impression Statement Pt continues to demonstrate findings consistent with R posterior canal BPPV.  Symptoms  persistent with R dix hallpike and R horizontal roll.  Recommended continue brandt daroff and horizontal roll at home as long as symptoms persist.     PT Frequency 1x / week  increase to 2x/wk PRN   PT Duration 4 weeks   PT Next Visit Plan reassess; tx as indicated   Consulted and Agree with Plan of Care Patient        Problem List Patient Active Problem List   Diagnosis Date Noted  . SNHL (sensory-neural hearing loss), asymmetrical 06/26/2015  . BPV (benign positional vertigo) 06/12/2015  . History of MI (myocardial infarction) 05/26/2015  . Hyperlipidemia 05/26/2015  . Elevated liver enzymes 05/26/2015   Clarita CraneStephanie F Magaline Steinberg, PT, DPT 07/10/2015 9:25 AM  [facility] [address] [place], [place], [address] Phone: [phone]   Fax:  [phone]  Name: Zachary Harvey MRN: [id number] Date of Birth: 04/26/1961

## 2015-07-10 NOTE — Progress Notes (Signed)
Chart CC: Zachary MouseJose L Harvey is a [age] y.o. male is here for No chief complaint on file.   Subjective: HPI:  He would like to talk about his vertigo. He's been seen at physical therapy twice now and states that he thinks is helping. On Saturday he had no dizziness whatsoever so he slacked off on some of his home exercises however upon awakening on Sunday dizziness returned. Symptoms are strongly sure he does not feel comfortable driving when they're present. He denies any other motor or sensory disturbances other than some epigastric discomfort. Denies any hearing loss or visual disturbance. No falls.  Return of his epigastric discomfort that is nonradiating. It's improved temporarily with Mylanta. He is also taking Protonix on a daily basis. He denies any nausea, vomiting diarrhea or constipation.   Review Of Systems Outlined In HPI  Past Medical History  Diagnosis Date  . Hypertension   . Hyperlipidemia   . Myocardial infarction (HCC)     No past surgical history on file. Family History  Problem Relation Age of Onset  . Diabetes Mother   . Heart disease Mother   . Kidney disease Mother     Social History   Social History  . Marital Status: Married    Spouse Name: N/A  . Number of Children: N/A  . Years of Education: N/A   Occupational History  . Not on file.   Social History Main Topics  . Smoking status: Never Smoker   . Smokeless tobacco: Never Used  . Alcohol Use: No  . Drug Use: No  . Sexual Activity: Yes    Birth Control/ Protection: None   Other Topics Concern  . Not on file   Social History Narrative     Objective: There were no vitals taken for this visit.  Vital signs reviewed. General: Alert and Oriented, No Acute Distress HEENT: Pupils equal, round, reactive to light. Conjunctivae clear.  External ears unremarkable.  Moist mucous membranes. Lungs: Clear and comfortable work of breathing, speaking in full sentences without accessory muscle  use. Cardiac: Regular rate and rhythm.  Neuro: CN II-XII grossly intact, gait normal. Extremities: No peripheral edema.  Strong peripheral pulses.  Mental Status: No depression, anxiety, nor agitation. Logical though process. Skin: Warm and dry.  Assessment & Plan: Diagnoses and all orders for this visit:  BPV (benign positional vertigo), bilateral  Gastroesophageal reflux disease without esophagitis -     sucralfate (CARAFATE) 1 g tablet; Take 1 tablet (1 g total) by mouth 4 (four) times daily as needed (gastritis or heartburn).   BPV: Conveyed my apologies that there is not medication that helps with this and that treatment will be purely focused on physical therapy. Continue to follow up with them twice a week and do home rehabilitation Exercise daily GERD: Uncontrolled chronic condition, continue on Protonix adding when necessary sucralfate.  Out of work for 1 additional week. Will go week to week for additional work notes   Return if symptoms worsen or fail to improve.

## 2015-07-14 ENCOUNTER — Ambulatory Visit: Payer: Commercial Managed Care - PPO | Admitting: Rehabilitative and Restorative Service Providers"

## 2015-07-14 ENCOUNTER — Ambulatory Visit (INDEPENDENT_AMBULATORY_CARE_PROVIDER_SITE_OTHER): Payer: Commercial Managed Care - PPO | Admitting: Family Medicine

## 2015-07-14 ENCOUNTER — Encounter: Payer: Self-pay | Admitting: Family Medicine

## 2015-07-14 ENCOUNTER — Other Ambulatory Visit: Payer: Self-pay

## 2015-07-14 VITALS — BP 113/75 | HR 69 | Wt 189.0 lb

## 2015-07-14 DIAGNOSIS — H8113 Benign paroxysmal vertigo, bilateral: Secondary | ICD-10-CM | POA: Diagnosis not present

## 2015-07-14 DIAGNOSIS — H8111 Benign paroxysmal vertigo, right ear: Secondary | ICD-10-CM

## 2015-07-14 NOTE — Progress Notes (Signed)
CC: Zachary Harvey is a 55 y.o. male is here for Dizziness   Subjective: HPI:  Follow up vertigo: As of Tuesday he's had no further dizziness. Denies any other sensory disturbances. No motor disturbances. Denies headache or vision disturbance. He would like to know if he can go back to work. He has no other complaints today other than some itching in both ears. Denies hearing loss.   Review Of Systems Outlined In HPI  Past Medical History  Diagnosis Date  . Hypertension   . Hyperlipidemia   . Myocardial infarction (HCC)     No past surgical history on file. Family History  Problem Relation Age of Onset  . Diabetes Mother   . Heart disease Mother   . Kidney disease Mother     Social History   Social History  . Marital Status: Married    Spouse Name: N/A  . Number of Children: N/A  . Years of Education: N/A   Occupational History  . Not on file.   Social History Main Topics  . Smoking status: Never Smoker   . Smokeless tobacco: Never Used  . Alcohol Use: No  . Drug Use: No  . Sexual Activity: Yes    Birth Control/ Protection: None   Other Topics Concern  . Not on file   Social History Narrative     Objective: BP 113/75 mmHg  Pulse 69  Wt 189 lb (85.73 kg)  General: Alert and Oriented, No Acute Distress HEENT: Pupils equal, round, reactive to light. Conjunctivae clear.  External ears unremarkable, canals clear with intact TMs with appropriate landmarks.  Middle ear appears open without effusion. Pink inferior turbinates.  Moist mucous membranes, pharynx without inflammation nor lesions.  Neck supple without palpable lymphadenopathy nor abnormal masses. Extremities: No peripheral edema.  Strong peripheral pulses.  Mental Status: No depression, anxiety, nor agitation. Skin: Warm and dry.  Assessment & Plan: Zachary Harvey was seen today for dizziness.  Diagnoses and all orders for this visit:  BPV (benign positional vertigo), bilateral   BPV has now resolved I  have written him to be returning to work without resections on Monday.  No Follow-up on file.

## 2015-07-14 NOTE — Therapy (Signed)
[facility] [address] [place], [place], [address] Phone: [phone]   Fax:  [phone]  Physical Therapy Treatment  Patient Details  Name: Zachary Harvey MRN: [id number] Date of Birth: December 01, 1960 Referring Provider: Rosalin Hawking  Encounter Date: 07/14/2015      PT End of Session - 07/14/15 1037    Visit Number 3   Number of Visits 4   Date for PT Re-Evaluation 08/04/15   PT Start Time 1022   PT Stop Time 1034   PT Time Calculation (min) 12 min   Activity Tolerance Patient tolerated treatment well   Behavior During Therapy [facility] for tasks assessed/performed      Past Medical History  Diagnosis Date  . Hypertension   . Hyperlipidemia   . Myocardial infarction ([place])     No past surgical history on file.  There were no vitals filed for this visit.  Visit Diagnosis:  BPPV (benign paroxysmal positional vertigo), right      Subjective Assessment - 07/14/15 1026    Subjective The patient reports he thinks symptoms are resolved.  He has tried to do HEP for the last couple of days and does not note any symptoms.   Patient Stated Goals improve dizziness   Currently in Pain? No/denies                Vestibular Assessment - 07/14/15 1026    Positional Testing   Dix-Hallpike Dix-Hallpike Right;Dix-Hallpike Left   Horizontal Canal Testing Horizontal Canal Right;Horizontal Canal Left   Dix-Hallpike Right   Dix-Hallpike Right Duration none   Dix-Hallpike Right Symptoms No nystagmus   Dix-Hallpike Left   Dix-Hallpike Left Duration none   Dix-Hallpike Left Symptoms No nystagmus   Horizontal Canal Right   Horizontal Canal Right Duration none   Horizontal Canal Right Symptoms Normal   Horizontal Canal Left   Horizontal Canal Left Duration none   Horizontal Canal Left Symptoms Normal                 [facility] Adult PT Treatment/Exercise - 07/14/15 1034    Self-Care   Self-Care Other Self-Care  Comments   Other Self-Care Comments  PT and patient discussed self management of recurring BPPV, as this is the 3rd time in his history he has experienced BPPV.  Recommended he keep habituation exercises available, but no evidence to support doing daily to keep BPPV from reocurring.                 PT Education - 07/14/15 1036    Education provided Yes   Education Details HEP: self mgmt of BPPV   Person(s) Educated Patient   Methods Explanation;Handout   Comprehension Verbalized understanding             PT Long Term Goals - 07/14/15 1038    PT LONG TERM GOAL #1   Title independent with HEP (08/04/15)   Status Achieved   PT LONG TERM GOAL #2   Title demonstrate negative positional testing (08/04/15)   Status Achieved               Plan - 07/14/15 1038    Clinical Impression Statement The patient has met LTGs for physical therapy.  He has negative positional testing today and reports resolution of symptoms.   PT Next Visit Plan d/c today   Consulted and Agree with Plan of Care Patient        Problem List Patient Active Problem List  Diagnosis Date Noted  . GERD (gastroesophageal reflux disease) 07/10/2015  . SNHL (sensory-neural hearing loss), asymmetrical 06/26/2015  . BPV (benign positional vertigo) 06/12/2015  . History of MI (myocardial infarction) 05/26/2015  . Hyperlipidemia 05/26/2015  . Elevated liver enzymes 05/26/2015    PHYSICAL THERAPY DISCHARGE SUMMARY  Visits from Start of Care: 3  Current functional level related to goals / functional outcomes: See goals above   Remaining deficits: None today   Education / Equipment: HEP for self mgmt of BPPV  Plan: Patient agrees to discharge.  Patient goals were met. Patient is being discharged due to meeting the stated rehab goals.  ?????       Thank you for the referral of this patient. Rudell Cobb, MPT  [place], PT 07/14/2015, 12:51 PM  [facility] [address] [place], [place], [address] Phone: [phone]   Fax:  [phone]  Name: Zachary Harvey MRN: [id number] Date of Birth: [birth year]

## 2015-07-17 ENCOUNTER — Ambulatory Visit: Payer: Commercial Managed Care - PPO | Admitting: Family Medicine

## 2015-07-18 ENCOUNTER — Encounter: Payer: Commercial Managed Care - PPO | Admitting: Physical Therapy

## 2015-07-21 ENCOUNTER — Telehealth: Payer: Self-pay

## 2015-07-21 NOTE — Telephone Encounter (Signed)
Pepto bismol and generic Immodium AD (loperamide)

## 2015-07-21 NOTE — Telephone Encounter (Signed)
Pt.notified

## 2015-07-21 NOTE — Telephone Encounter (Signed)
Pt is wanting to know what can he take OTC for stomach ahces and diarrhea.  He is currently out the state driving trucks.  Please advise.

## 2015-10-09 ENCOUNTER — Ambulatory Visit: Payer: Commercial Managed Care - PPO | Admitting: Neurology

## 2015-10-23 ENCOUNTER — Ambulatory Visit (INDEPENDENT_AMBULATORY_CARE_PROVIDER_SITE_OTHER): Payer: Commercial Managed Care - PPO | Admitting: Osteopathic Medicine

## 2015-10-23 ENCOUNTER — Encounter: Payer: Self-pay | Admitting: Osteopathic Medicine

## 2015-10-23 VITALS — BP 131/71 | HR 70 | Wt 188.0 lb

## 2015-10-23 DIAGNOSIS — R0789 Other chest pain: Secondary | ICD-10-CM

## 2015-10-23 NOTE — Patient Instructions (Signed)

## 2015-10-23 NOTE — Progress Notes (Signed)
HPI: Zachary Harvey is a 55 y.o. male who [name] today for chief complaint of:  Chief Complaint  Patient presents with  . Chest Pain     . Context: previous MI 4 years ago, patient is concerned about chest pain today . Location: chest on L . Quality: pinching . 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 . Assoc signs/symptoms: "big symptoms are my leg or arm get numb," which hasn't happened, works as [employer] architecttruck driver. (+) dizziness described as lightheadedness, he does have a history of vertigo as well.     Past medical, social and family history reviewed: Past Medical History  Diagnosis Date  . Hypertension   . Hyperlipidemia   . Myocardial infarction (HCC)    No past surgical history on file. Social History  Substance Use Topics  . Smoking status: Never Smoker   . Smokeless tobacco: Never Used  . Alcohol Use: No   Family History  Problem Relation Age of Onset  . Diabetes Mother   . Heart disease Mother   . Kidney disease Mother     Current Outpatient Prescriptions  Medication Sig Dispense Refill  . acetaminophen (TYLENOL) 325 MG tablet Take 650 mg by mouth every 6 (six) hours as needed. Reported on 07/07/2015    . aspirin 325 MG tablet Take 325 mg by mouth daily.    Marland Kitchen. atorvastatin (LIPITOR) 80 MG tablet Take 80 mg by mouth daily.    . Chlorphen-Pseudoephed-APAP (CORICIDIN D PO) Take by mouth.    . fluocinolone (SYNALAR) 0.01 % external solution Apply topically 2 (two) times daily. Takes 5 drops in right ear    . metoprolol tartrate (LOPRESSOR) 25 MG tablet Take 25 mg by mouth 2 (two) times daily.    . MULTIPLE VITAMIN PO Take by mouth.    . nitroGLYCERIN (NITROSTAT) 0.4 MG SL tablet Place 0.4 mg  under the tongue every 5 (five) minutes as needed for chest pain.    . pantoprazole (PROTONIX) 40 MG tablet Take 1 tablet (40 mg total) by mouth daily. To help prevent stomach pain. 30 tablet 3  . sucralfate (CARAFATE) 1 g tablet Take 1 tablet (1 g total) by mouth 4 (four) times daily as needed (gastritis or heartburn). 45 tablet 1   No current facility-administered medications for this visit.   No Known Allergies    Review of Systems: CONSTITUTIONAL:  No  fever, no chills, No recent illness, No unintentional weight changes HEAD/EYES/EARS/NOSE/THROAT: No  headache, no vision change CARDIAC: (+) chest pain, No  pressure, No palpitations, No  orthopnea RESPIRATORY: No  cough, No  shortness of breath/wheeze GASTROINTESTINAL: No  nausea, No  vomiting, No  abdominal pain SKIN: No  rash/wounds/concerning lesions NEUROLOGIC: No  weakness, (+)dizziness, No  slurred speech   Exam:  BP 131/71 mmHg  Pulse 70  Wt 188 lb (85.276 kg)  SpO2 99% Constitutional: VS see above. General Appearance: alert, well-developed, well-nourished, NAD Eyes: Normal lids and conjunctive, non-icteric sclera, PERRLA Ears, Nose, Mouth, Throat: MMM Neck: No masses, trachea midline. No thyroid enlargement. No tenderness/mass appreciated. No lymphadenopathy Respiratory: Normal respiratory effort. no wheeze, no rhonchi, no rales Cardiovascular: S1/S2 normal, no murmur, no rub/gallop auscultated. RRR. No carotid bruit or JVD.  No abdominal aortic bruit. Pedal pulse II/IV bilaterally DP and PT. No lower extremity edema. Gastrointestinal: Nontender, no masses.  Musculoskeletal: Gait normal. No clubbing/cyanosis of digits. Nontender chest wall but patient states that the pinching chest pain is reproduced when I have him with arms outstretched in front of him, shoulder forward flexed at about 90 and pushing upward against resistance Neurological: No cranial nerve deficit on limited exam. Psychiatric: Normal judgment/insight.  Normal mood and affect. Oriented x3.   Records reviewed -   EKG interpretation: Rate: 71 Rhythm: sinus No ST/T changes concerning for acute ischemia/infarct  (+)Q II, aVF No previous EKG available for comparison with tracing, but report from [facility] 06/04/15 (+) anterolateral ST elevation likely repolarization variant likely normal. [facility] reports 03/20/15 ST no longer elevated, T wave inversion in inferior leads  [facility] - Stress Echo 05/26/15 - negative   [facility], last visit note 06/05/15 for routine follow-up w/ Dr Pete PeltWalter Tan.   Patient did have some elevated liver enzymes in January, here to have decreased by February. Sugar has been high but no fasting labs available.   ASSESSMENT/PLAN:   EKG demonstrates no acute findings, I advised the patient that while EKG appears benign to me at this moment, while his symptoms are more consistent with musculoskeletal chest pain, the only way to definitively rule out cardiac cause would be monitoring in the emergency room, which patient declines to do at this time.   Could also consider follow-up with cardiology for stress test, consider Holter monitoring as his description of intermittent sharp twinges would also be consistent with pre-mature contractions though he denies palpitation symptoms. Patient states that this pain does not feel like the crushing pain that he had with his previous MI.   Dizziness/lightheadedness is a bit concerning, the patient also has vertigo history. Advised increased hydration.   Consider diabetes screen, no cholesterol on file - will defer to PCP for management chronic issues.  I believe chest pain most likely musculoskeletal, however since patient has declined ER referral at this time I have asked him to follow-up with his PCP in one week and have documented this on his return to work note   Patient also on Cardizem about decreasing his dose of aspirin, I advised that there are certainly risks to  consider with high-dose aspirin long-term but he should talk to his cardiologist about this he states that they put him on the higher dose aspirin. Not sure why this would have been done as he is not recently recovering from MI or stroke, though I do see where his cardiologist discontinued aspirin 81 mg at his most recent visit in February  Other chest pain - Plan: EKG 12-Lead    All questions were answered. Visit summary with medication list and pertinent instructions was printed for patient to review. ER/RTC precautions were reviewed with the patient. Return in about 1 week (around 10/30/2015), or if symptoms worsen or fail to improve, for FOLLOW UP WITH DR HOMMEL.

## 2015-10-30 ENCOUNTER — Ambulatory Visit: Payer: Commercial Managed Care - PPO | Admitting: Family Medicine

## 2015-11-01 ENCOUNTER — Encounter: Payer: Self-pay | Admitting: Family Medicine

## 2015-11-01 ENCOUNTER — Ambulatory Visit (INDEPENDENT_AMBULATORY_CARE_PROVIDER_SITE_OTHER): Payer: Commercial Managed Care - PPO | Admitting: Family Medicine

## 2015-11-01 VITALS — BP 128/82 | HR 83 | Wt 191.0 lb

## 2015-11-01 DIAGNOSIS — R079 Chest pain, unspecified: Secondary | ICD-10-CM | POA: Diagnosis not present

## 2015-11-01 DIAGNOSIS — I2089 Other forms of angina pectoris: Secondary | ICD-10-CM | POA: Insufficient documentation

## 2015-11-01 DIAGNOSIS — B3749 Other urogenital candidiasis: Secondary | ICD-10-CM | POA: Diagnosis not present

## 2015-11-01 DIAGNOSIS — I259 Chronic ischemic heart disease, unspecified: Secondary | ICD-10-CM | POA: Insufficient documentation

## 2015-11-01 DIAGNOSIS — I208 Other forms of angina pectoris: Secondary | ICD-10-CM | POA: Insufficient documentation

## 2015-11-01 DIAGNOSIS — K649 Unspecified hemorrhoids: Secondary | ICD-10-CM

## 2015-11-01 MED ORDER — CLOTRIMAZOLE-BETAMETHASONE 1-0.05 % EX CREA: TOPICAL_CREAM | CUTANEOUS | Status: AC

## 2015-11-01 MED ORDER — HYDROCORTISONE ACE-PRAMOXINE 1-1 % RE FOAM: 1.0000 | Freq: Two times a day (BID) | RECTAL | Status: DC

## 2015-11-01 NOTE — Progress Notes (Signed)
CC: Zachary Harvey is a 55 y.o. male is here for Follow-up   Subjective: HPI:  Follow-up chest pain: He saw a cardiologist earlier today he says that he'll likely need to be out of work for another 2 weeks. This could possibly change if his stress test that's in the process of being scheduled is normal. Patient states that since he saw Dr. Lyn HollingsheadAlexander he's no longer having any chest discomfort. When he was having the pain is described as a sharpness on the left side of his chest. It happened days after he was lifting heavy equipment at work. He denies any exertional chest pain. He denies any new shortness of breath or lightheadedness.  He's been doing some hemorrhoids off and on for the majority of his life. Recently a flareup is not responding to Preparation H. It's itchy and slightly tender but there has been no bleeding. He denies any constipation or diarrhea. No abdominal pain.  He has a white patch on the bottom of his penis that's been there for a matter of years. It sometimes tender. If it's ever exposed to friction and will become friable. He was given some unknown cream to treat the patch years ago however didn't seem to help much. Family thinks it was probably Monistat or something like that. He tells me it occasionally itches. It's never gotten bigger or smaller since he first noticed it. Denies any other genitourinary complaints.   Review Of Systems Outlined In HPI  Past Medical History  Diagnosis Date  . Hypertension   . Hyperlipidemia   . Myocardial infarction (HCC)     No past surgical history on file. Family History  Problem Relation Age of Onset  . Diabetes Mother   . Heart disease Mother   . Kidney disease Mother     Social History   Social History  . Marital Status: Married    Spouse Name: N/A  . Number of Children: N/A  . Years of Education: N/A   Occupational History  . Not on file.   Social History Main Topics  . Smoking status: Never Smoker   . Smokeless  tobacco: Never Used  . Alcohol Use: No  . Drug Use: No  . Sexual Activity: Yes    Birth Control/ Protection: None   Other Topics Concern  . Not on file   Social History Narrative     Objective: BP 128/82 mmHg  Pulse 83  Wt 191 lb (86.637 kg)  Vital signs reviewed. General: Alert and Oriented, No Acute Distress HEENT: Pupils equal, round, reactive to light. Conjunctivae clear.  External ears unremarkable.  Moist mucous membranes. Lungs: Clear and comfortable work of breathing, speaking in full sentences without accessory muscle use. Cardiac: Regular rate and rhythm.  Neuro: CN II-XII grossly intact, gait normal. Extremities: No peripheral edema.  Strong peripheral pulses.  Mental Status: No depression, anxiety, nor agitation. Logical though process. Skin: Warm and dry. White patch proximally 1 cm in diameter which is slightly raised with a scale appearance of the underside of the penis just proximal to the glans.  Assessment & Plan: Zachary Harvey was seen today for follow-up.  Diagnoses and all orders for this visit:  Chest pain, unspecified chest pain type  Hemorrhoids, unspecified hemorrhoid type -     hydrocortisone-pramoxine (PROCTOFOAM HC) rectal foam; Place 1 applicator rectally 2 (two) times daily. For a total of one week.  Yeast dermatitis of penis -     clotrimazole-betamethasone (LOTRISONE) cream; Apply to affected area twice  a day for two weeks, may require four weeks if involving the feet/toes.   Chest pain: Discussed with him that I think it's very unlikely that is coming from his heart however we cannot rule this out until he gets the heart at stress test. I filled out his FMLA paperwork so that he is out of work until the 19th of this month. Hemorrhoids: Start Proctofoam and increase fiber in the diet Suspect yeast dermatitis of penis therefore start Lotrisone he may need a biopsy of this if this doesn't help.  40 minutes spent face-to-face during visit today of  which at least 50% was counseling or coordinating care regarding: 1. Chest pain, unspecified chest pain type   2. Hemorrhoids, unspecified hemorrhoid type   3. Yeast dermatitis of penis      No Follow-up on file.

## 2015-11-17 ENCOUNTER — Ambulatory Visit (INDEPENDENT_AMBULATORY_CARE_PROVIDER_SITE_OTHER): Payer: Commercial Managed Care - PPO | Admitting: Family Medicine

## 2015-11-17 ENCOUNTER — Encounter: Payer: Self-pay | Admitting: Family Medicine

## 2015-11-17 DIAGNOSIS — Z0289 Encounter for other administrative examinations: Secondary | ICD-10-CM

## 2015-11-17 NOTE — Progress Notes (Signed)
Late, not charged for no show but will count as 1 of 3 missed visits

## 2015-11-20 ENCOUNTER — Encounter: Payer: Self-pay | Admitting: Family Medicine

## 2015-11-20 ENCOUNTER — Ambulatory Visit (INDEPENDENT_AMBULATORY_CARE_PROVIDER_SITE_OTHER): Payer: Commercial Managed Care - PPO | Admitting: Family Medicine

## 2015-11-20 VITALS — BP 125/81 | HR 83 | Wt 185.0 lb

## 2015-11-20 DIAGNOSIS — I255 Ischemic cardiomyopathy: Secondary | ICD-10-CM

## 2015-11-20 DIAGNOSIS — K6289 Other specified diseases of anus and rectum: Secondary | ICD-10-CM | POA: Diagnosis not present

## 2015-11-20 DIAGNOSIS — R739 Hyperglycemia, unspecified: Secondary | ICD-10-CM | POA: Diagnosis not present

## 2015-11-20 MED ORDER — METFORMIN HCL 1000 MG PO TABS: ORAL_TABLET | ORAL | 2 refills | Status: DC

## 2015-11-20 MED ORDER — DILTIAZEM GEL 2 %: 1.0000 "application " | Freq: Two times a day (BID) | CUTANEOUS | 2 refills | Status: DC

## 2015-11-20 NOTE — Progress Notes (Signed)
CC: Zachary Harvey is a 55 y.o. male is here for Results (stress test )   Subjective: HPI:  Recently found to have sugar in his urine, an A1c was checked and it was 7.2, he's got a strong family history of type 2 diabetes. 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 Denies polyuria or polyphagia or vision disturbance other than having to wear glasses daily.Marland Kitchen He's never been on blood sugar lowering medications.  He also tells me he feels like his fissure is coming back, he was diagnosed with an anal fissure years ago. It's worse with firm large bowel movements. Proctofoam is not helping. Somewhat itchy and tender to the touch. He denies any masses or rectal bleeding   Review Of Systems Outlined In HPI  Past Medical History:  Diagnosis Date  . Hyperlipidemia   . Hypertension   . Myocardial infarction (HCC)     No past surgical history on file. Family History  Problem Relation Age of Onset  . Diabetes Mother   . Heart disease Mother   . Kidney disease Mother     Social History   Social History  . Marital status: Married    Spouse name: N/A  . Number of children: N/A  . Years of education: N/A   Occupational History  . Not on file.   Social History Main Topics  . Smoking status: Never Smoker  . Smokeless tobacco: Never Used  . Alcohol use No  . Drug use: No  . Sexual activity: Yes    Birth control/ protection: None   Other Topics Concern  . Not on file   Social History Narrative  . No narrative on file     Objective: BP 125/81   Pulse 83   Wt 185 lb (83.9 kg)   BMI 28.98 kg/m   Vital signs reviewed. General: Alert and Oriented, No Acute Distress HEENT: Pupils equal, round, reactive to light. Conjunctivae clear.  External ears unremarkable.  Moist mucous membranes. Lungs: Clear and comfortable work of breathing, speaking in full sentences without accessory muscle use. Cardiac: Regular rate and rhythm.  Neuro: CN II-XII grossly intact, gait  normal. Extremities: No peripheral edema.  Strong peripheral pulses.  Mental Status: No depression, anxiety, nor agitation. Logical though process. Skin: Warm and dry.  Assessment & Plan: Zachary Harvey was seen today for results.  Diagnoses and all orders for this visit:  Rectal pain  Hyperglycemia  Ischemic cardiomyopathy  Other orders -     metFORMIN (GLUCOPHAGE) 1000 MG tablet; One tablet by mouth every evening for blood sugar control. -     diltiazem 2 % GEL; Apply 1 application topically 2 (two) times daily.   Ischemic retinopathy: He has a nuclear stress test set up for later this week. Hyperglycemia: Start metformin, return for a nurse visit on Friday to have his urine checked to see if it's appropriate for him to go back and complete his DOT physical. Rectal pain: Start diltiazem topical gel to the rectum to the prevent further tearing and allow healing of the fissure.  Return for Friday UA Nurse Visit - For Sugar In Urine.

## 2015-11-23 ENCOUNTER — Telehealth: Payer: Self-pay | Admitting: Family Medicine

## 2015-11-23 MED ORDER — DILTIAZEM GEL 2 %: 1.0000 "application " | Freq: Two times a day (BID) | CUTANEOUS | 2 refills | Status: DC

## 2015-11-23 NOTE — Telephone Encounter (Signed)
[facility] pharmacy called to inform the Rx for diltiazem 2 % GEL is not carried at their pharmacy. States if PCP wants this Rx it will need to go to a compounding pharmacy. Will route.

## 2015-11-23 NOTE — Telephone Encounter (Signed)
Zachary Harvey, Can you please see if [facility] in [place] is able to compound this Rx? Rx  In your in box.

## 2015-11-24 ENCOUNTER — Ambulatory Visit (INDEPENDENT_AMBULATORY_CARE_PROVIDER_SITE_OTHER): Payer: Commercial Managed Care - PPO | Admitting: Family Medicine

## 2015-11-24 VITALS — BP 126/59 | HR 72 | Wt 186.0 lb

## 2015-11-24 DIAGNOSIS — R81 Glycosuria: Secondary | ICD-10-CM | POA: Diagnosis not present

## 2015-11-24 LAB — POCT URINALYSIS DIPSTICK
BILIRUBIN UA: NEGATIVE
Glucose, UA: 100
KETONES UA: NEGATIVE
Leukocytes, UA: NEGATIVE
Nitrite, UA: NEGATIVE
PH UA: 7
PROTEIN UA: NEGATIVE
SPEC GRAV UA: 1.025
Urobilinogen, UA: 1

## 2015-11-24 NOTE — Progress Notes (Signed)
Patient came into clinic today for repeat UA. At last visit there was glucose present in the urine, that is better this time. Spoke with PCP regarding metformin Rx, Pt reports he takes this Rx at night and wakes up very dizzy. PCP advised to take Rx in am with breakfast. There was also trace blood present in UA, will send urine for Microscope per PCP request. 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 He will be leaving this weekend and advised to follow up when he gets back in town. verbalized understanding.

## 2015-11-25 LAB — URINALYSIS, MICROSCOPIC ONLY
BACTERIA UA: NONE SEEN [HPF]
Casts: NONE SEEN [LPF]
Crystals: NONE SEEN [HPF]
RBC / HPF: NONE SEEN RBC/HPF (ref <=2)
Squamous Epithelial / LPF: NONE SEEN [HPF] (ref <=5)
WBC UA: NONE SEEN WBC/HPF (ref <=5)
Yeast: NONE SEEN [HPF]

## 2015-11-26 ENCOUNTER — Encounter: Payer: Self-pay | Admitting: Family Medicine

## 2015-11-27 ENCOUNTER — Encounter: Payer: Self-pay | Admitting: Family Medicine

## 2015-11-27 DIAGNOSIS — R319 Hematuria, unspecified: Secondary | ICD-10-CM | POA: Insufficient documentation

## 2015-11-27 NOTE — Telephone Encounter (Signed)
Rx faxed

## 2016-02-19 ENCOUNTER — Other Ambulatory Visit: Payer: Self-pay | Admitting: *Deleted

## 2016-02-19 MED ORDER — METFORMIN HCL 1000 MG PO TABS: ORAL_TABLET | ORAL | 0 refills | Status: DC

## 2016-02-21 ENCOUNTER — Telehealth: Payer: Self-pay | Admitting: Family Medicine

## 2016-02-21 NOTE — Telephone Encounter (Signed)
I called pt and left a message informing him that he needs to call the office to establish care with one of Dr.Hommel's partners and for med refills

## 2016-03-15 ENCOUNTER — Other Ambulatory Visit: Payer: Self-pay | Admitting: Family Medicine

## 2016-04-07 ENCOUNTER — Other Ambulatory Visit: Payer: Self-pay | Admitting: Family Medicine

## 2016-04-30 ENCOUNTER — Other Ambulatory Visit: Payer: Self-pay

## 2016-04-30 MED ORDER — METFORMIN HCL 1000 MG PO TABS: ORAL_TABLET | ORAL | 0 refills | Status: DC

## 2016-05-06 ENCOUNTER — Encounter: Payer: Self-pay | Admitting: Physician Assistant

## 2016-05-06 ENCOUNTER — Ambulatory Visit: Payer: Commercial Managed Care - PPO | Admitting: Physician Assistant

## 2016-05-06 ENCOUNTER — Ambulatory Visit (INDEPENDENT_AMBULATORY_CARE_PROVIDER_SITE_OTHER): Payer: Commercial Managed Care - PPO | Admitting: Physician Assistant

## 2016-05-06 VITALS — BP 131/79 | HR 79 | Wt 188.0 lb

## 2016-05-06 DIAGNOSIS — E663 Overweight: Secondary | ICD-10-CM | POA: Diagnosis not present

## 2016-05-06 DIAGNOSIS — R740 Nonspecific elevation of levels of transaminase and lactic acid dehydrogenase [LDH]: Secondary | ICD-10-CM | POA: Diagnosis not present

## 2016-05-06 DIAGNOSIS — E119 Type 2 diabetes mellitus without complications: Secondary | ICD-10-CM | POA: Diagnosis not present

## 2016-05-06 DIAGNOSIS — R7401 Elevation of levels of liver transaminase levels: Secondary | ICD-10-CM

## 2016-05-06 DIAGNOSIS — E782 Mixed hyperlipidemia: Secondary | ICD-10-CM | POA: Diagnosis not present

## 2016-05-06 DIAGNOSIS — Z0000 Encounter for general adult medical examination without abnormal findings: Secondary | ICD-10-CM

## 2016-05-06 DIAGNOSIS — E781 Pure hyperglyceridemia: Secondary | ICD-10-CM

## 2016-05-06 DIAGNOSIS — S30810A Abrasion of lower back and pelvis, initial encounter: Secondary | ICD-10-CM

## 2016-05-06 LAB — POCT GLYCOSYLATED HEMOGLOBIN (HGB A1C): Hemoglobin A1C: 7

## 2016-05-06 MED ORDER — METFORMIN HCL ER 750 MG PO TB24: 750.0000 mg | ORAL_TABLET | Freq: Every day | ORAL | 6 refills | Status: DC

## 2016-05-06 MED ORDER — TRIAMCINOLONE 0.1 % CREAM:EUCERIN CREAM 1:1: 1.0000 "application " | TOPICAL_CREAM | Freq: Two times a day (BID) | CUTANEOUS | 6 refills | Status: DC

## 2016-05-06 NOTE — Patient Instructions (Signed)
Please go downstairs for blood work  Come back and see me in 3-4 weeks about your stomach and to recheck your buttocks

## 2016-05-06 NOTE — Progress Notes (Signed)
HPI:                                                                Zachary Harvey is a 56 y.o. male who presents to [facility]: Primary Care Sports Medicine today to establish care and c/o rectal discomfort  DM II: patient diagnosed on 11/20/15 found to have an A1C of 7.2. He was started on Metformin 500mg  daily. He does not check his sugars. He denies hypoglycemic symptoms. Does not smoke.  HTN: taking Lopressor 25mg . Does not check BP's at home. Denies headache, chest pain, dyspnea, lightheadedness, and edema.   HLD/CAD: followed by [facility] for ischemic cardiomyopathy. 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 He does not check his blood sugar or blood pressures on the road.  Additionally patient complains of dryness and itching in his rectal area. He denies rectal pain, tenesmus, or hematochezia, Patient has a history of anal skin tag that was excised on 05/27/13. He is currently using Diltiazem gel daily, but it is not helping.    Colonoscopy due Tetanus due He declined influenza vaccine today.  Health Maintenance Health Maintenance  Topic Date Due  . HEMOGLOBIN A1C  04/20/63  . Hepatitis C Screening  03-05-63  . PNEUMOCOCCAL POLYSACCHARIDE VACCINE (1) 07/11/1964  . FOOT EXAM  07/11/1972  . OPHTHALMOLOGY EXAM  07/11/1972  . HIV Screening  07/11/1977  . TETANUS/TDAP  07/11/1981  . COLONOSCOPY  07/11/2012  . INFLUENZA VACCINE  05/06/2017 (Originally 11/28/2015)    Health Habits  Diet: "poor," eats at rest areas on the road  Exercise: none  ETOH: no  Tobacco: no  Drugs: no  Dental Exam: up to date  Eye Exam: sees optometrist, but has not had a diabetic eye exam  Past Medical History:  Diagnosis Date  . BPPV (benign paroxysmal positional vertigo)   . GERD (gastroesophageal reflux disease)   . Hyperlipidemia    . Hypertension   . Myocardial infarction    History reviewed. No pertinent surgical history. Social History  Substance Use Topics  . Smoking status: Never Smoker  . Smokeless tobacco: Never Used  . Alcohol use No   family history includes Diabetes in his mother; Heart disease in his mother; Kidney disease in his mother.  Review of Systems  Constitutional: Negative for chills, fever and weight loss.  HENT: Positive for hearing loss.   Eyes: Positive for blurred vision (wears corrective lenses). Negative for double vision and pain.  Respiratory: Negative for cough and shortness of breath.   Cardiovascular: Positive for chest pain (exertional). Negative for palpitations and leg swelling.  Gastrointestinal: Positive for heartburn. Negative for abdominal pain and blood in stool.       "belching"  Skin: Negative for rash.  Neurological: Positive for dizziness. Negative for headaches.  Psychiatric/Behavioral: Negative.     Medications: Current Outpatient Prescriptions  Medication Sig Dispense Refill  . aspirin 81 MG tablet Take 81 mg by mouth daily.    Marland Kitchen atorvastatin (LIPITOR) 80 MG tablet Take 80 mg by mouth daily.    . Chlorphen-Pseudoephed-APAP (CORICIDIN D PO) Take by mouth.    . isosorbide mononitrate (  IMDUR) 30 MG 24 hr tablet Take 30 mg by mouth.    . metoprolol tartrate (LOPRESSOR) 25 MG tablet Take 25 mg by mouth 2 (two) times daily.    . MULTIPLE VITAMIN PO Take by mouth.    . nitroGLYCERIN (NITROSTAT) 0.4 MG SL tablet Place 0.4 mg under the tongue every 5 (five) minutes as needed for chest pain.    . metFORMIN (GLUCOPHAGE-XR) 750 MG 24 hr tablet Take 1 tablet (750 mg total) by mouth daily with breakfast. 30 tablet 6  . Triamcinolone Acetonide (TRIAMCINOLONE 0.1 % CREAM : EUCERIN) CREA Apply 1 application topically 2 (two) times daily. 2 each 6   No current facility-administered medications for this visit.    No Known Allergies     Objective:  BP 131/79   Pulse 79    Wt 188 lb (85.3 kg)   BMI 29.44 kg/m  Gen: well-groomed, cooperative, not ill-appearing, no distress HEENT: normal conjunctiva, TM's clear, oropharynx clear, poor dentition, moist mucus membranes, no thyromegaly or tenderness Lungs: Normal work of breathing, clear to auscultation bilaterally Heart: Normal rate, regular rhythm, s1 and s2 distinct, no murmurs, clicks or rubs appreciated on this exam, no carotid bruit Abd: Soft. Nondistended, Nontender, No external hemorrhoid Neuro: alert and oriented x 3, EOM's intact, PERRLA, DTR's intact MSK: strength 5/5 and symmetric, normal gait Extremities: distal pulses intact, no peripheral edema Skin: warm and dry, chaperoned rectal exam reveals three approx. 0.3cm excoriations of the right medial buttock without exudate or surrounding erythema Psych: normal affect, pleasant mood, normal speech and thought content  Diabetic Foot Exam - Simple   Simple Foot Form Diabetic Foot exam was performed with the following findings:  Yes 05/06/2016  1:20 PM  Visual Inspection No deformities, no ulcerations, no other skin breakdown bilaterally:  Yes Sensation Testing Intact to touch and monofilament testing bilaterally:  Yes Pulse Check Posterior Tibialis and Dorsalis pulse intact bilaterally:  Yes Comments    Results for orders placed or performed in visit on 05/06/16 (from the past 72 hour(s))  POCT HgB A1C     Status: None   Collection Time: 05/06/16  1:50 PM  Result Value Ref Range   Hemoglobin A1C 7.0    No results found.    Assessment and Plan: 56 y.o. male with   Encounter for preventative adult health care examination - Ambulatory referral to Gastroenterology for screening colonoscopy - COMPLETE METABOLIC PANEL WITH GFR - CBC - Lipid panel - Hepatitis C antibody - TSH - VITAMIN D 25 Hydroxy (Vit-D Deficiency, Fractures)  Mixed hyperlipidemia / CAD - patient on high-intensity statin - cont nitro as needed for angina - cont to  follow-up with Cardiology  Type 2 diabetes mellitus without complication, without long-term current use of insulin (HCC) - increasing Metformin to 750mg  XR to achieve goal of 6.5 Lab Results  Component Value Date   HGBA1C 7.0 05/06/2016  - metFORMIN (GLUCOPHAGE-XR) 750 MG 24 hr tablet; Take 1 tablet (750 mg total) by mouth daily with breakfast.  Dispense: 30 tablet; Refill: 6  Excoriation of buttock, initial encounter - Triamcinolone Acetonide (TRIAMCINOLONE 0.1 % CREAM : EUCERIN) CREA; Apply 1 application topically 2 (two) times daily.  Dispense: 2 each; Refill: 6  Instructed patient to return in 3-4 weeks to address his vertigo and GERD concerns.  Patient education and anticipatory guidance given Patient agrees with treatment plan Follow-up in 1 month or sooner as needed  Levonne Hubertharley E. Iyah Laguna PA-C

## 2016-05-07 ENCOUNTER — Other Ambulatory Visit: Payer: Self-pay | Admitting: Physician Assistant

## 2016-05-07 DIAGNOSIS — E559 Vitamin D deficiency, unspecified: Secondary | ICD-10-CM

## 2016-05-07 DIAGNOSIS — E781 Pure hyperglyceridemia: Secondary | ICD-10-CM | POA: Insufficient documentation

## 2016-05-07 LAB — LIPID PANEL
Cholesterol: 145 mg/dL (ref <200)
HDL: 36 mg/dL — ABNORMAL LOW (ref >40)
Total CHOL/HDL Ratio: 4 Ratio (ref <5.0)
Triglycerides: 520 mg/dL — ABNORMAL HIGH (ref <150)

## 2016-05-07 LAB — COMPLETE METABOLIC PANEL WITH GFR
ALT: 64 U/L — ABNORMAL HIGH (ref 9–46)
AST: 48 U/L — ABNORMAL HIGH (ref 10–35)
Albumin: 3.8 g/dL (ref 3.6–5.1)
Alkaline Phosphatase: 85 U/L (ref 40–115)
BILIRUBIN TOTAL: 0.5 mg/dL (ref 0.2–1.2)
BUN: 16 mg/dL (ref 7–25)
CHLORIDE: 104 mmol/L (ref 98–110)
CO2: 26 mmol/L (ref 20–31)
Calcium: 9.4 mg/dL (ref 8.6–10.3)
Creat: 0.81 mg/dL (ref 0.70–1.33)
GFR, Est Non African American: >89 mL/min (ref >=60)
GLUCOSE: 143 mg/dL — AB (ref 65–99)
Potassium: 4.1 mmol/L (ref 3.5–5.3)
Sodium: 139 mmol/L (ref 135–146)
TOTAL PROTEIN: 7.6 g/dL (ref 6.1–8.1)

## 2016-05-07 LAB — CBC
HEMATOCRIT: 43.6 % (ref 38.5–50.0)
Hemoglobin: 14.6 g/dL (ref 13.2–17.1)
MCH: 30.5 pg (ref 27.0–33.0)
MCHC: 33.5 g/dL (ref 32.0–36.0)
MCV: 91 fL (ref 80.0–100.0)
MPV: 10.8 fL (ref 7.5–12.5)
PLATELETS: 292 10*3/uL (ref 140–400)
RBC: 4.79 MIL/uL (ref 4.20–5.80)
RDW: 13.9 % (ref 11.0–15.0)
WBC: 7.2 10*3/uL (ref 3.8–10.8)

## 2016-05-07 LAB — VITAMIN D 25 HYDROXY (VIT D DEFICIENCY, FRACTURES): VIT D 25 HYDROXY: 24 ng/mL — AB (ref 30–100)

## 2016-05-07 LAB — TSH: TSH: 1.22 m[IU]/L (ref 0.40–4.50)

## 2016-05-07 LAB — HEPATITIS C ANTIBODY: HCV AB: NEGATIVE

## 2016-05-07 MED ORDER — VITAMIN D (ERGOCALCIFEROL) 1.25 MG (50000 UNIT) PO CAPS: 50000.0000 [IU] | ORAL_CAPSULE | ORAL | 0 refills | Status: DC

## 2016-05-07 NOTE — Progress Notes (Signed)
Patient found to have elevated triglycerides on lipid panel. Patient was not fasting for labs. He is on a high-intensity statin.  Lab Results  Component Value Date   CHOL 145 05/06/2016   HDL 36 (L) 05/06/2016   LDLCALC NOT CALC 05/06/2016   TRIG 520 (H) 05/06/2016   CHOLHDL 4.0 05/06/2016   Previous lipid panel from 02/12/11 TC - 218 TG - 239 HDL - 40 LDL - 130  Additionally patient had mildly elevated transaminases. He is overweight, borderline obese with BMI 29.44. Denies alcohol use.  Lab Results  Component Value Date   ALT 64 (H) 05/06/2016   AST 48 (H) 05/06/2016   ALKPHOS 85 05/06/2016   BILITOT 0.5 05/06/2016    Plan to repeat fasting lipids and hepatic function panel when patient returns in 1-3 months. Informed patient to avoid fried and fatty foods, increase whole grains, fruits and vegetables. Also informed to avoid alcohol and tylenol products.  Levonne Hubertharley E. Cummings PA-C

## 2016-05-07 NOTE — Addendum Note (Signed)
Addended by: Gena FrayUMMINGS, CHARLEY E on: 05/07/2016 04:23 PM   Modules accepted: Orders

## 2016-09-20 ENCOUNTER — Encounter: Payer: Self-pay | Admitting: Physician Assistant

## 2016-09-20 ENCOUNTER — Ambulatory Visit (INDEPENDENT_AMBULATORY_CARE_PROVIDER_SITE_OTHER): Payer: Commercial Managed Care - PPO | Admitting: Physician Assistant

## 2016-09-20 VITALS — BP 125/77 | HR 75 | Wt 185.0 lb

## 2016-09-20 DIAGNOSIS — E119 Type 2 diabetes mellitus without complications: Secondary | ICD-10-CM | POA: Diagnosis not present

## 2016-09-20 DIAGNOSIS — I259 Chronic ischemic heart disease, unspecified: Secondary | ICD-10-CM | POA: Diagnosis not present

## 2016-09-20 DIAGNOSIS — N481 Balanitis: Secondary | ICD-10-CM | POA: Diagnosis not present

## 2016-09-20 DIAGNOSIS — N471 Phimosis: Secondary | ICD-10-CM

## 2016-09-20 DIAGNOSIS — N480 Leukoplakia of penis: Secondary | ICD-10-CM | POA: Diagnosis not present

## 2016-09-20 LAB — POCT UA - MICROALBUMIN

## 2016-09-20 LAB — POCT GLYCOSYLATED HEMOGLOBIN (HGB A1C): HEMOGLOBIN A1C: 7.3

## 2016-09-20 MED ORDER — METFORMIN HCL ER 500 MG PO TB24
1000.0000 mg | ORAL_TABLET | Freq: Two times a day (BID) | ORAL | 3 refills | Status: DC
Start: 2016-09-20 — End: 2017-05-06

## 2016-09-20 MED ORDER — FLUCONAZOLE 150 MG PO TABS: 150.0000 mg | ORAL_TABLET | Freq: Once | ORAL | 0 refills | Status: AC

## 2016-09-20 MED ORDER — PREDNISONE 50 MG PO TABS: ORAL_TABLET | ORAL | 0 refills | Status: DC

## 2016-09-20 MED ORDER — NITROGLYCERIN 0.4 MG SL SUBL: 0.4000 mg | SUBLINGUAL_TABLET | SUBLINGUAL | 3 refills | Status: DC | PRN

## 2016-09-20 MED ORDER — AMBULATORY NON FORMULARY MEDICATION: 0 refills | Status: AC

## 2016-09-20 NOTE — Patient Instructions (Addendum)
For Rash: - Take Prednisone (steroid) daily for 5 days - Take Fluconazole once - I have placed a referral for you to see a dermatologist to assist in diagnosis the skin rash - I have also placed a referral for you to see a urologist for the painful foreskin retraction (phimosis) - Continue Lotrisone (Clotrimazole-Betamethasone) twice a day  For Diabetes: - Check your blood sugars when you are having vision changes - Follow-up with Ophthalmology for your annual diabetic eye exam - Your A1C was 7.3 today, Your goal is 6.5 - We are going to increase your Metformin to 1000 twice a day with meals - I have also placed a referral for you to follow-up with a diabetes educator   Carbohydrate Counting for Diabetes Mellitus, Adult Carbohydrate counting is a method for keeping track of how many carbohydrates you eat. Eating carbohydrates naturally increases the amount of sugar (glucose) in the blood. Counting how many carbohydrates you eat helps keep your blood glucose within normal limits, which helps you manage your diabetes (diabetes mellitus). It is important to know how many carbohydrates you can safely have in each meal. This is different for every person. A diet and nutrition specialist (registered dietitian) can help you make a meal plan and calculate how many carbohydrates you should have at each meal and snack. Carbohydrates are found in the following foods:  Grains, such as breads and cereals.  Dried beans and soy products.  Starchy vegetables, such as potatoes, peas, and corn.  Fruit and fruit juices.  Milk and yogurt.  Sweets and snack foods, such as cake, cookies, candy, chips, and soft drinks. How do I count carbohydrates? There are two ways to count carbohydrates in food. You can use either of the methods or a combination of both. Reading "Nutrition Facts" on packaged food  The "Nutrition Facts" list is included on the labels of almost all packaged foods and beverages in the  [place] It includes:  The serving size.  Information about nutrients in each serving, including the grams (g) of carbohydrate per serving. To use the "Nutrition Facts":  Decide how many servings you will have.  Multiply the number of servings by the number of carbohydrates per serving.  The resulting number is the total amount of carbohydrates that you will be having. Learning standard serving sizes of other foods  When you eat foods containing carbohydrates that are not packaged or do not include "Nutrition Facts" on the label, you need to measure the servings in order to count the amount of carbohydrates:  Measure the foods that you will eat with a food scale or measuring cup, if needed.  Decide how many standard-size servings you will eat.  Multiply the number of servings by 15. Most carbohydrate-rich foods have about 15 g of carbohydrates per serving.  For example, if you eat 8 oz (170 g) of strawberries, you will have eaten 2 servings and 30 g of carbohydrates (2 servings x 15 g = 30 g).  For foods that have more than one food mixed, such as soups and casseroles, you must count the carbohydrates in each food that is included. The following list contains standard serving sizes of common carbohydrate-rich foods. Each of these servings has about 15 g of carbohydrates:   hamburger bun or  English muffin.   oz (15 mL) syrup.   oz (14 g) jelly.  1 slice of bread.  1 six-inch tortilla.  3 oz (85 g) cooked rice or pasta.  4 oz (113  g) cooked dried beans.  4 oz (113 g) starchy vegetable, such as peas, corn, or potatoes.  4 oz (113 g) hot cereal.  4 oz (113 g) mashed potatoes or  of a large baked potato.  4 oz (113 g) canned or frozen fruit.  4 oz (120 mL) fruit juice.  4-6 crackers.  6 chicken nuggets.  6 oz (170 g) unsweetened dry cereal.  6 oz (170 g) plain fat-free yogurt or yogurt sweetened with artificial sweeteners.  8 oz (240 mL) milk.  8 oz (170 g)  fresh fruit or one small piece of fruit.  24 oz (680 g) popped popcorn. Example of carbohydrate counting Sample meal   3 oz (85 g) chicken breast.  6 oz (170 g) brown rice.  4 oz (113 g) corn.  8 oz (240 mL) milk.  8 oz (170 g) strawberries with sugar-free whipped topping. Carbohydrate calculation  1. Identify the foods that contain carbohydrates:  Rice.  Corn.  Milk.  Strawberries. 2. Calculate how many servings you have of each food:  2 servings rice.  1 serving corn.  1 serving milk.  1 serving strawberries. 3. Multiply each number of servings by 15 g:  2 servings rice x 15 g = 30 g.  1 serving corn x 15 g = 15 g.  1 serving milk x 15 g = 15 g.  1 serving strawberries x 15 g = 15 g. 4. Add together all of the amounts to find the total grams of carbohydrates eaten:  30 g + 15 g + 15 g + 15 g = 75 g of carbohydrates total. This information is not intended to replace advice given to you by your health care provider. Make sure you discuss any questions you have with your health care provider. Document Released: 04/15/2005 Document Revised: 11/03/2015 Document Reviewed: 09/27/2015 Elsevier Interactive Patient Education  2017 [employer]   Tips for Eating Away From Home If You Have Diabetes Controlling your level of blood glucose, also known as blood sugar, can be challenging. It can be even more difficult when you do not prepare your own meals. The following tips can help you manage your diabetes when you eat away from home. Planning ahead Plan ahead if you know you will be eating away from home:  Ask your health care provider how to time meals and medicine if you are taking insulin.  Make a list of restaurants near you that offer healthy choices. If they have a carry-out menu, take it home and plan what you will order ahead of time.  Look up the restaurant you want to eat at online. Many chain and fast-food restaurants list nutritional information online.  Use this information to choose the healthiest options and to calculate how many carbohydrates will be in your meal.  Use a carbohydrate-counting book or mobile app to look up the carbohydrate content and serving size of the foods you want to eat.  Become familiar with serving sizes and learn to recognize how many servings are in a portion. This will allow you to estimate how many carbohydrates you can eat. Free foods A "free food" is any food or drink that has less than 5 g of carbohydrates per serving. Free foods include:  Many vegetables.  Hard boiled eggs.  Nuts or seeds.  Olives.  Cheeses.  Meats. These types of foods make good appetizer choices and are often available at salad bars. Lemon juice, vinegar, or a low-calorie salad dressing of fewer than  20 calories per serving can be used as a "free" salad dressing. Choices to reduce carbohydrates  Substitute nonfat sweetened yogurt with a sugar-free yogurt. Yogurt made from soy milk may also be used, but you will still want a sugar-free or plain option to choose a lower carbohydrate amount.  Ask your server to take away the bread basket or chips from your table.  Order fresh fruit. A salad bar often offers fresh fruit choices. Avoid canned fruit because it is usually packed in sugar or syrup.  Order a salad, and eat it without dressing. Or, create a "free" salad dressing.  Ask for substitutions. For example, instead of [place] fries, request an order of a vegetable such as salad, green beans, or broccoli. Other tips  If you take insulin, take the insulin once your food arrives to your table. This will ensure your insulin and food are timed correctly.  Ask your server about the portion size before your order, and ask for a take-out box if the portion has more servings than you should have. When your food comes, leave the amount you should have on the plate, and put the rest in the take-out box.  Consider splitting an entree with  someone and ordering a side salad. This information is not intended to replace advice given to you by your health care provider. Make sure you discuss any questions you have with your health care provider. Document Released: 04/15/2005 Document Revised: 09/21/2015 Document Reviewed: 07/13/2013 Elsevier Interactive Patient Education  2017 [employer].

## 2016-09-20 NOTE — Progress Notes (Signed)
HPI:                                                                Zachary Harvey is a 56 y.o. male who presents to [facility]: Primary Care Sports Medicine today for diabetes follow-up  Current concerns include penile rash, refill Nitro  Patient reports a pruritic rash and cracking of his foreskin that has been worsening for years. He also reports difficulty and pain with retracting his foreskin.  He states he has seen multiple providers for this and has tried different topical prescription medications. He is currently using Lotrisone.  DMII: taking Metformin XR 750mg  nightly. Compliant with medications. Last A1C 7.0 (05/06/16). Does not check blood sugars at home. He endorses occasional blurred vision. 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 Denies polyuria and paresthesias. Denies hypoglycemic events. Denies ulcers/wounds on feet. Eye exam: overdue Foot exam: complete  Chronic ischemic heart disease: followed by Dr. Dorena Cookey, [facility] cardiology. He has a history of stable angina. Denies any increased chest pain. Denies syncope, dyspnea, orthopnea, PND. Last stress ECHO (10/2015)showed EF 50-55% at rest. Dobutamine stress Echo was negative for wall motion abnormalities or inducible ischemia. Past Medical History:  Diagnosis Date  . Allergy   . BPPV (benign paroxysmal positional vertigo)   . GERD (gastroesophageal reflux disease)   . Hyperlipidemia   . Hypertension   . Myocardial infarction (HCC)    No past surgical history on file. Social History  Substance Use Topics  . Smoking status: Never Smoker  . Smokeless tobacco: Never Used  . Alcohol use No   family history includes Diabetes in his mother; Heart disease in his mother; Kidney disease in his mother.  ROS: negative except as noted in the HPI  Medications: Current Outpatient Prescriptions  Medication Sig Dispense Refill  . aspirin 81 MG tablet Take 81 mg by mouth  daily.    Marland Kitchen atorvastatin (LIPITOR) 80 MG tablet Take 80 mg by mouth daily.    . clotrimazole-betamethasone (LOTRISONE) cream Apply 1 application topically 2 (two) times daily.    . isosorbide mononitrate (IMDUR) 30 MG 24 hr tablet Take 30 mg by mouth.    . metFORMIN (GLUCOPHAGE-XR) 750 MG 24 hr tablet Take 1 tablet (750 mg total) by mouth daily with breakfast. 30 tablet 6  . metoprolol tartrate (LOPRESSOR) 25 MG tablet Take 25 mg by mouth 2 (two) times daily.    . MULTIPLE VITAMIN PO Take by mouth.    . Triamcinolone Acetonide (TRIAMCINOLONE 0.1 % CREAM : EUCERIN) CREA Apply 1 application topically 2 (two) times daily. 2 each 6  . Chlorphen-Pseudoephed-APAP (CORICIDIN D PO) Take by mouth.    . fluconazole (DIFLUCAN) 150 MG tablet Take 1 tablet (150 mg total) by mouth once. 1 tablet 0  . nitroGLYCERIN (NITROSTAT) 0.4 MG SL tablet Place 1 tablet (0.4 mg total) under the tongue every 5 (five) minutes as needed for chest pain. 5 tablet 3  . predniSONE (DELTASONE) 50 MG tablet One tab PO daily for 5 days. 5 tablet 0  . Vitamin D, Ergocalciferol, (DRISDOL) 50000 units CAPS capsule Take 1 capsule (50,000 Units total) by mouth every 7 (seven) days. Take for 8 total doses(weeks) (Patient not taking:  Reported on 09/20/2016) 8 capsule 0   No current facility-administered medications for this visit.    No Known Allergies     Objective:  BP (!) 145/80   Pulse 77   Wt 185 lb (83.9 kg)   BMI 28.98 kg/m  Gen: well-groomed, cooperative, not ill-appearing, no distress HEENT: normal conjunctiva, wearing glasses Pulm: Normal work of breathing, normal phonation GU: uncircumcised penis, patient has difficulty retracting foreskin, fissuring of the foreskin, glans penis with thick, white plaque Neuro: alert and oriented x 3, EOM's intact, no tremor MSK: moving all extremities, normal gait and station, no peripheral edema  Results for orders placed or performed in visit on 09/20/16 (from the past 72 hour(s))   POCT HgB A1C     Status: Abnormal   Collection Time: 09/20/16 11:17 AM  Result Value Ref Range   Hemoglobin A1C 7.3   POCT UA - Microalbumin     Status: Normal   Collection Time: 09/20/16 11:38 AM  Result Value Ref Range   Microalbumin Ur, POC 30mg /l mg/L   Creatinine, POC 300mg /dl mg/dL   Albumin/Creatinine Ratio, Urine, POC 30mg /g    No results found.    Assessment and Plan: 56 y.o. male with   1. Diabetes mellitus without complication (HCC) - POCT HgB A1C 7.4, goal A1c <7 - POCT UA - Microalbumin negative - Ambulatory referral to diabetic education - Ambulatory referral to Ophthalmology for diabetic eye exam - increasing Metformin to 1000mg  bid - metFORMIN (GLUCOPHAGE XR) 500 MG 24 hr tablet; Take 2 tablets (1,000 mg total) by mouth 2 (two) times daily with a meal.  Dispense: 120 tablet; Refill: 3 - AMBULATORY NON FORMULARY MEDICATION; Single glucometer with lancets, test strips  Dispense: 1 each; Refill: 0  2. 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  Dispense: 5 tablet; Refill: 0 - fluconazole (DIFLUCAN) 150 MG tablet; Take 1 tablet (150 mg total) by mouth once.  Dispense: 1 tablet; Refill: 0  3. Phimosis - Ambulatory referral to Urology  4. Chronic ischemic heart disease - refilled Nitroglycerin - keep follow-up appointments with Cardiology annually - cont baby Jonne Plysa - working toward better A1C control as above  Patient education and anticipatory guidance given Patient agrees with treatment plan Follow-up in 3 months or sooner as needed if symptoms worsen or fail to improve  Levonne Hubertharley E. Emelda Kohlbeck PA-C

## 2016-09-20 NOTE — Progress Notes (Signed)
   Subjective:    I'm seeing this patient as a consultation for:  Gena Frayharley Cummings, PA-C  CC: Penile rash  HPI: This is a pleasant 56 year old male, for years and years he has had a lesion on his penis, he has intermittent cracking of the skin on his foreskin which makes it very difficult to retract, and very painful. He also has a fairly thickened rash on his glans and foreskin, whitish, this mostly itches. He doesn't have any testicular pain, swelling, no dysuria, no constitutional symptoms. Has seen multiple providers without a diagnosis or management plan. He is using topical Lotrisone without much relief. Lotrisone simply takes care of the itching but not the rash of the pain.  Past medical history:  Negative.  See flowsheet/record as well for more information.  Surgical history: Negative.  See flowsheet/record as well for more information.  Family history: Negative.  See flowsheet/record as well for more information.  Social history: Negative.  See flowsheet/record as well for more information.  Allergies, and medications have been entered into the medical record, reviewed, and no changes needed.   Review of Systems: No headache, visual changes, nausea, vomiting, diarrhea, constipation, dizziness, abdominal pain, skin rash, fevers, chills, night sweats, weight loss, swollen lymph nodes, body aches, joint swelling, muscle aches, chest pain, shortness of breath, mood changes, visual or auditory hallucinations.   Objective:   General: Well Developed, well nourished, and in no acute distress.  Neuro/Psych: Alert and oriented x3, extra-ocular muscles intact, able to move all 4 extremities, sensation grossly intact. Skin: Warm and dry, no rashes noted.  Respiratory: Not using accessory muscles, speaking in full sentences, trachea midline.  Cardiovascular: Pulses palpable, no extremity edema. Abdomen: Does not appear distended. Groin: No testicular masses, foreskin is phimotic, there are  areas of cracks in the skin without signs of bacterial superinfection, there is whitish, thickened/lichenified skin over the glans and the inside of the foreskin when we were finally able to retract it.  Impression and Recommendations:   This case required medical decision making of moderate complexity.  Balanitis xerotica obliterans [place] Lotrisone cream, adding a burst of prednisone and Diflucan to calm things down. I do think ultimately he will need circumcision, as well as consultation with dermatology. Primary treating provider will provide referral for dermatology and urology. 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

## 2016-09-20 NOTE — Assessment & Plan Note (Signed)
[place] Lotrisone cream, adding a burst of prednisone and Diflucan to calm things down. I do think ultimately he will need circumcision, as well as consultation with dermatology. Primary treating provider will provide referral for dermatology and urology. 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

## 2016-09-24 LAB — HSV 1/2 AB (IGM), IFA W/RFLX TITER
HSV 1 IGM SCREEN: NEGATIVE
HSV 2 IGM SCREEN: NEGATIVE

## 2016-09-27 ENCOUNTER — Telehealth: Payer: Self-pay

## 2016-09-27 DIAGNOSIS — N481 Balanitis: Secondary | ICD-10-CM

## 2016-09-27 MED ORDER — NAPROXEN 500 MG PO TABS: 500.0000 mg | ORAL_TABLET | Freq: Two times a day (BID) | ORAL | 0 refills | Status: DC

## 2016-09-27 MED ORDER — ACETAMINOPHEN 500 MG PO TABS
1000.0000 mg | ORAL_TABLET | Freq: Four times a day (QID) | ORAL | 0 refills | Status: DC | PRN
Start: 2016-09-27 — End: 2017-10-03

## 2016-09-27 NOTE — Telephone Encounter (Signed)
Prescription sent for Naprosyn Take 1 tab twice a day as needed for pain He can also take Tylenol/Acetaminophen 1000mg  every 6 hours as needed at the same time Remind him to follow-up with Urology and Dermatology as soon as possible

## 2016-09-27 NOTE — Telephone Encounter (Signed)
Wife reports that husband is in more pain.  He finished the prednisone.  She stated that he feels he has more cuts on the penis and is requesting something for pain. Please advise. -EH/RMA

## 2016-10-26 ENCOUNTER — Other Ambulatory Visit: Payer: Self-pay | Admitting: Physician Assistant

## 2016-10-26 DIAGNOSIS — N481 Balanitis: Secondary | ICD-10-CM

## 2016-10-28 ENCOUNTER — Ambulatory Visit: Payer: Commercial Managed Care - PPO | Admitting: Physician Assistant

## 2016-12-06 ENCOUNTER — Ambulatory Visit: Payer: Commercial Managed Care - PPO | Admitting: *Deleted

## 2016-12-23 LAB — HM DIABETES EYE EXAM

## 2017-01-09 ENCOUNTER — Encounter: Payer: Self-pay | Admitting: Physician Assistant

## 2017-01-09 DIAGNOSIS — H43812 Vitreous degeneration, left eye: Secondary | ICD-10-CM

## 2017-01-09 DIAGNOSIS — H02839 Dermatochalasis of unspecified eye, unspecified eyelid: Secondary | ICD-10-CM

## 2017-01-09 DIAGNOSIS — H268 Other specified cataract: Secondary | ICD-10-CM

## 2017-01-09 HISTORY — DX: Vitreous degeneration, left eye: H43.812

## 2017-01-09 HISTORY — DX: Dermatochalasis of unspecified eye, unspecified eyelid: H02.839

## 2017-01-09 HISTORY — DX: Other specified cataract: H26.8

## 2017-01-20 ENCOUNTER — Ambulatory Visit: Payer: Commercial Managed Care - PPO | Admitting: Dietician

## 2017-01-24 ENCOUNTER — Encounter: Payer: Self-pay | Admitting: Physician Assistant

## 2017-01-28 ENCOUNTER — Ambulatory Visit: Payer: Commercial Managed Care - PPO | Admitting: Registered"

## 2017-03-18 ENCOUNTER — Other Ambulatory Visit: Payer: Self-pay

## 2017-03-18 NOTE — Telephone Encounter (Signed)
Chart review. Zachary Harvey,CMA  

## 2017-05-06 ENCOUNTER — Telehealth: Payer: Self-pay | Admitting: Physician Assistant

## 2017-05-06 DIAGNOSIS — E119 Type 2 diabetes mellitus without complications: Secondary | ICD-10-CM

## 2017-05-06 MED ORDER — METFORMIN HCL ER 500 MG PO TB24: 1000.0000 mg | ORAL_TABLET | Freq: Two times a day (BID) | ORAL | 0 refills | Status: DC

## 2017-05-06 NOTE — Telephone Encounter (Addendum)
Please advise on fax refill request from [facility] for pt's Metformin HCL ER tabs 500mg  for a 90 day supply.   Spoke with [place] in office and she gave ok to send a 30 day supply due to the fact that pt is due to follow up appt. Rx sent with ov is needed for future refills.

## 2017-05-15 ENCOUNTER — Telehealth: Payer: Self-pay

## 2017-05-15 NOTE — Telephone Encounter (Signed)
Left recommendations on vm.  Also advised pt to call back and make f/u appointment. -EH/RMA

## 2017-05-15 NOTE — Telephone Encounter (Signed)
Pt is questioning his dose of metformin. Rx currently reads: take two 500 mg tablet BID.  Pt reports only taking 1 tablet BID.  He is ok with taking 2000 mg daily he just want to only take 2 pills if possible.  Pt notified that he is due for a follow up appointment. Please advise. -EH/RMA

## 2017-05-15 NOTE — Telephone Encounter (Signed)
Yes, it should be 1000 twice a day with meals He is due for A1c check

## 2017-09-27 ENCOUNTER — Other Ambulatory Visit: Payer: Self-pay | Admitting: Physician Assistant

## 2017-09-27 DIAGNOSIS — E119 Type 2 diabetes mellitus without complications: Secondary | ICD-10-CM

## 2017-10-03 ENCOUNTER — Ambulatory Visit (INDEPENDENT_AMBULATORY_CARE_PROVIDER_SITE_OTHER): Payer: Commercial Managed Care - PPO | Admitting: Physician Assistant

## 2017-10-03 ENCOUNTER — Encounter: Payer: Self-pay | Admitting: Physician Assistant

## 2017-10-03 VITALS — BP 113/71 | HR 73 | Wt 182.0 lb

## 2017-10-03 DIAGNOSIS — Z1211 Encounter for screening for malignant neoplasm of colon: Secondary | ICD-10-CM | POA: Diagnosis not present

## 2017-10-03 DIAGNOSIS — E1165 Type 2 diabetes mellitus with hyperglycemia: Secondary | ICD-10-CM | POA: Insufficient documentation

## 2017-10-03 DIAGNOSIS — E1169 Type 2 diabetes mellitus with other specified complication: Secondary | ICD-10-CM | POA: Diagnosis not present

## 2017-10-03 DIAGNOSIS — Z6828 Body mass index (BMI) 28.0-28.9, adult: Secondary | ICD-10-CM | POA: Diagnosis not present

## 2017-10-03 DIAGNOSIS — Z5181 Encounter for therapeutic drug level monitoring: Secondary | ICD-10-CM

## 2017-10-03 DIAGNOSIS — Z23 Encounter for immunization: Secondary | ICD-10-CM

## 2017-10-03 DIAGNOSIS — E785 Hyperlipidemia, unspecified: Secondary | ICD-10-CM | POA: Diagnosis not present

## 2017-10-03 LAB — POCT GLYCOSYLATED HEMOGLOBIN (HGB A1C): HbA1c, POC (controlled diabetic range): 7.9 % — AB (ref 0.0–7.0)

## 2017-10-03 MED ORDER — BLOOD GLUCOSE MONITOR KIT: PACK | 0 refills | Status: DC

## 2017-10-03 MED ORDER — METFORMIN HCL ER (MOD) 1000 MG PO TB24: 1000.0000 mg | ORAL_TABLET | Freq: Two times a day (BID) | ORAL | 0 refills | Status: DC

## 2017-10-03 MED ORDER — METFORMIN HCL ER 500 MG PO TB24: 1000.0000 mg | ORAL_TABLET | Freq: Two times a day (BID) | ORAL | 0 refills | Status: DC

## 2017-10-03 NOTE — Progress Notes (Signed)
HPI:                                                                Zachary Harvey is a 57 y.o. male who presents to [facility] [facility]: [place] today for medication management  DMII: taking Metformin ER daily. Not compliant with medications. He was instructed to take 1000 mg bid, but he is only taking 500. Denies polydipsia, polyuria, polyphagia. Reports occasional blurred vision and lightheadedness, so he will drink a soda. Denies extremity pain, altered sensation and paresthesias.  Denies ulcers/wounds on feet. Hx of DKA/HHS: no Diabetes associated symptoms: heart disease, skin tags Glucometer: did not fill Blood glucose readings: does not check Hypoglycemia frequency: n/a Severe hypoglycemia (requiring 3rd party assistance): n/a  CAD/HLD: he is followed by Cardiology, Dr. Clovia Cuff.  Past Medical History:  Diagnosis Date  . Allergy   . BPPV (benign paroxysmal positional vertigo)   . CAD (coronary artery disease)   . Combined nonsenile cataract 01/09/2017  . Dermatochalasis 01/09/2017   Observation per Dr. Larose Kells  . Diabetes mellitus without complication ([place])   . GERD (gastroesophageal reflux disease)   . Hyperlipidemia   . Hypertension   . Myocardial infarction ([place])   . Posterior vitreous detachment of left eye 01/09/2017   Past Surgical History:  Procedure Laterality Date  . CORONARY/GRAFT ACUTE MI REVASCULARIZATION  2012   Social History   Tobacco Use  . Smoking status: Never Smoker  . Smokeless tobacco: Never Used  Substance Use Topics  . Alcohol use: No   family history includes Diabetes in his mother; Heart disease in his mother; Kidney disease in his mother.    ROS: negative except as noted in the HPI  Medications: Current Outpatient Medications  Medication Sig Dispense Refill  . AMBULATORY NON FORMULARY MEDICATION Single glucometer with lancets, test strips 1 each 0  . aspirin 81 MG tablet Take 81 mg by mouth daily.     Marland Kitchen atorvastatin (LIPITOR) 80 MG tablet Take 80 mg by mouth daily.    . blood glucose meter kit and supplies KIT Check morning fasting glucose daily and up to four times daily as needed 1 each 0  . isosorbide mononitrate (IMDUR) 30 MG 24 hr tablet Take 30 mg by mouth.    . metFORMIN (GLUCOPHAGE XR) 500 MG 24 hr tablet Take 2 tablets (1,000 mg total) by mouth 2 (two) times daily with a meal. 360 tablet 0  . metoprolol tartrate (LOPRESSOR) 25 MG tablet Take 25 mg by mouth 2 (two) times daily.    . MULTIPLE VITAMIN PO Take by mouth.    . nitroGLYCERIN (NITROSTAT) 0.4 MG SL tablet Place 1 tablet (0.4 mg total) under the tongue every 5 (five) minutes as needed for chest pain. 5 tablet 3   No current facility-administered medications for this visit.    No Known Allergies     Objective:  BP 113/71   Pulse 73   Wt 182 lb (82.6 kg)   BMI 28.51 kg/m  Gen:  alert, not ill-appearing, no distress, appropriate for age 57: head normocephalic without obvious abnormality, conjunctiva and cornea clear, wearing glasses, trachea midline Pulm: Normal work of breathing, normal phonation, clear to auscultation bilaterally, no wheezes, rales or rhonchi CV: Normal  rate, regular rhythm, s1 and s2 distinct, no murmurs, clicks or rubs  Neuro: alert and oriented x 3, no tremor MSK: extremities atraumatic, normal gait and station Skin: intact, multiple skin tags of upper chest wall Psych: well-groomed, cooperative, good eye contact, euthymic mood, affect mood-congruent, speech is articulate, and thought processes clear and goal-directed  Diabetic Foot Exam - Simple   Simple Foot Form Diabetic Foot exam was performed with the following findings:  Yes 10/03/2017  3:09 PM  Visual Inspection See comments:  Yes Sensation Testing Intact to touch and monofilament testing bilaterally:  Yes Pulse Check Posterior Tibialis and Dorsalis pulse intact bilaterally:  Yes Comments There is some break of the skin on all 10  toes. -EH/RMA       Results for orders placed or performed in visit on 10/03/17 (from the past 72 hour(s))  POCT HgB A1C     Status: Abnormal   Collection Time: 10/03/17  2:54 PM  Result Value Ref Range   Hemoglobin A1C  4.0 - 5.6 %   HbA1c, POC (prediabetic range)  5.7 - 6.4 %   HbA1c, POC (controlled diabetic range) 7.9 (A) 0.0 - 7.0 %   No results found.    Assessment and Plan: 57 y.o. male with   Uncontrolled type 2 diabetes mellitus with hyperglycemia ([place]) - Plan: POCT HgB A1C, CBC, COMPLETE METABOLIC PANEL WITH GFR, Lipid Panel w/reflex Direct LDL, blood glucose meter kit and supplies KIT, metFORMIN (GLUCOPHAGE XR) 500 MG 24 hr tablet, DISCONTINUED: metFORMIN (GLUMETZA) 1000 MG (MOD) 24 hr tablet  Hyperlipidemia associated with type 2 diabetes mellitus ([place]) - Plan: Lipid Panel w/reflex Direct LDL  Encounter for medication monitoring - Plan: CBC, COMPLETE METABOLIC PANEL WITH GFR, Lipid Panel w/reflex Direct LDL  Need for 23-polyvalent pneumococcal polysaccharide vaccine - Plan: Pneumococcal polysaccharide vaccine 23-valent greater than or equal to 2yo subcutaneous/IM  Adult BMI 28.0-28.9 kg/sq m  Colon cancer screening - referred to GI 10/03/17 - Plan: Ambulatory referral to Gastroenterology  A1c is 7.9 today, increased from 7.3, due to medication non-adherence, non-compliance with follow-up and poor diet Long discussion about importance of diabetic diet. Recommended 1800 calories to achieve weight loss. Reviewed Metformin dosing instructions. Unfortunately we cannot change the tablet to 1000 mg formulation due to cost Pneumovax given today Foot exam performed today, mild skin breakdown Eye exam UTD, due August 2019 Checking lipid panel today, continue statin BP at goal, continue Lopressor. Cardiology is managing his ischemic cardiomyopathy, which is why he is not on an ACE/ARB   Patient education and anticipatory guidance given Patient agrees with treatment  plan Follow-up in 3 months for diabetes or sooner as needed if symptoms worsen or fail to improve  Darlyne Russian PA-C

## 2017-10-03 NOTE — Patient Instructions (Addendum)
For your diabetes - take Metformin 1000 mg twice a day - follow an 1800 calorie ADA diet to lose weight - limit carbs to 45g per meal and 20g per snack   Diabetes Preventive Care: - annual foot exam  - annual dilated eye exam with an eye doctor - self foot exams at least weekly - pneumonia vaccine once (booster in 5 years and at age 57) - annual influenza vaccine - twice yearly dental cleanings and yearly exam - goal blood pressure <140/90, ideally <130/80 - LDL cholesterol <70 - A1C <1.6 - body mass index (BMI) <25.0 - follow-up every 3 months if your A1C is not at goal - follow-up every 6 months if diabetes is well controlled   Diabetes Mellitus and Nutrition When you have diabetes (diabetes mellitus), it is very important to have healthy eating habits because your blood sugar (glucose) levels are greatly affected by what you eat and drink. Eating healthy foods in the appropriate amounts, at about the same times every day, can help you:  Control your blood glucose.  Lower your risk of heart disease.  Improve your blood pressure.  Reach or maintain a healthy weight.  Every person with diabetes is different, and each person has different needs for a meal plan. Your health care provider may recommend that you work with a diet and nutrition specialist (dietitian) to make a meal plan that is best for you. Your meal plan may vary depending on factors such as:  The calories you need.  The medicines you take.  Your weight.  Your blood glucose, blood pressure, and cholesterol levels.  Your activity level.  Other health conditions you have, such as heart or kidney disease.  How do carbohydrates affect me? Carbohydrates affect your blood glucose level more than any other type of food. Eating carbohydrates naturally increases the amount of glucose in your blood. Carbohydrate counting is a method for keeping track of how many carbohydrates you eat. Counting carbohydrates is  important to keep your blood glucose at a healthy level, especially if you use insulin or take certain oral diabetes medicines. It is important to know how many carbohydrates you can safely have in each meal. This is different for every person. Your dietitian can help you calculate how many carbohydrates you should have at each meal and for snack. Foods that contain carbohydrates include:  Bread, cereal, rice, pasta, and crackers.  Potatoes and corn.  Peas, beans, and lentils.  Milk and yogurt.  Fruit and juice.  Desserts, such as cakes, cookies, ice cream, and candy.  How does alcohol affect me? Alcohol can cause a sudden decrease in blood glucose (hypoglycemia), especially if you use insulin or take certain oral diabetes medicines. Hypoglycemia can be a life-threatening condition. Symptoms of hypoglycemia (sleepiness, dizziness, and confusion) are similar to symptoms of having too much alcohol. If your health care provider says that alcohol is safe for you, follow these guidelines:  Limit alcohol intake to no more than 1 drink per day for nonpregnant women and 2 drinks per day for men. One drink equals 12 oz of beer, 5 oz of wine, or 1 oz of hard liquor.  Do not drink on an empty stomach.  Keep yourself hydrated with water, diet soda, or unsweetened iced tea.  Keep in mind that regular soda, juice, and other mixers may contain a lot of sugar and must be counted as carbohydrates.  What are tips for following this plan? Reading food labels  Start by checking  the serving size on the label. The amount of calories, carbohydrates, fats, and other nutrients listed on the label are based on one serving of the food. Many foods contain more than one serving per package.  Check the total grams (g) of carbohydrates in one serving. You can calculate the number of servings of carbohydrates in one serving by dividing the total carbohydrates by 15. For example, if a food has 30 g of total  carbohydrates, it would be equal to 2 servings of carbohydrates.  Check the number of grams (g) of saturated and trans fats in one serving. Choose foods that have low or no amount of these fats.  Check the number of milligrams (mg) of sodium in one serving. Most people should limit total sodium intake to less than 2,300 mg per day.  Always check the nutrition information of foods labeled as "low-fat" or "nonfat". These foods may be higher in added sugar or refined carbohydrates and should be avoided.  Talk to your dietitian to identify your daily goals for nutrients listed on the label. Shopping  Avoid buying canned, premade, or processed foods. These foods tend to be high in fat, sodium, and added sugar.  Shop around the outside edge of the grocery store. This includes fresh fruits and vegetables, bulk grains, fresh meats, and fresh dairy. Cooking  Use low-heat cooking methods, such as baking, instead of high-heat cooking methods like deep frying.  Cook using healthy oils, such as olive, canola, or sunflower oil.  Avoid cooking with butter, cream, or high-fat meats. Meal planning  Eat meals and snacks regularly, preferably at the same times every day. Avoid going long periods of time without eating.  Eat foods high in fiber, such as fresh fruits, vegetables, beans, and whole grains. Talk to your dietitian about how many servings of carbohydrates you can eat at each meal.  Eat 4-6 ounces of lean protein each day, such as lean meat, chicken, fish, eggs, or tofu. 1 ounce is equal to 1 ounce of meat, chicken, or fish, 1 egg, or 1/4 cup of tofu.  Eat some foods each day that contain healthy fats, such as avocado, nuts, seeds, and fish. Lifestyle   Check your blood glucose regularly.  Exercise at least 30 minutes 5 or more days each week, or as told by your health care provider.  Take medicines as told by your health care provider.  Do not use any products that contain nicotine or  tobacco, such as cigarettes and e-cigarettes. If you need help quitting, ask your health care provider.  Work with a Veterinary surgeoncounselor or diabetes educator to identify strategies to manage stress and any emotional and social challenges. What are some questions to ask my health care provider?  Do I need to meet with a diabetes educator?  Do I need to meet with a dietitian?  What number can I call if I have questions?  When are the best times to check my blood glucose? Where to find more information:  American Diabetes Association: diabetes.org/food-and-fitness/food  Academy of Nutrition and Dietetics: https://www.vargas.com/www.eatright.org/resources/health/diseases-and-conditions/diabetes  [employer] Institute of Diabetes and Digestive and Kidney Diseases (NIH): FindJewelers.czwww.niddk.nih.gov/health-information/diabetes/overview/diet-eating-physical-activity Summary  A healthy meal plan will help you control your blood glucose and maintain a healthy lifestyle.  Working with a diet and nutrition specialist (dietitian) can help you make a meal plan that is best for you.  Keep in mind that carbohydrates and alcohol have immediate effects on your blood glucose levels. It is important to count carbohydrates and to  use alcohol carefully. This information is not intended to replace advice given to you by your health care provider. Make sure you discuss any questions you have with your health care provider. Document Released: 01/10/2005 Document Revised: 05/20/2016 Document Reviewed: 05/20/2016 Elsevier Interactive Patient Education  [employer].

## 2018-01-05 ENCOUNTER — Ambulatory Visit: Payer: Commercial Managed Care - PPO | Admitting: Physician Assistant

## 2018-03-23 ENCOUNTER — Telehealth: Payer: Self-pay

## 2018-03-23 ENCOUNTER — Other Ambulatory Visit: Payer: Self-pay | Admitting: Physician Assistant

## 2018-03-23 DIAGNOSIS — Z5181 Encounter for therapeutic drug level monitoring: Secondary | ICD-10-CM

## 2018-03-23 DIAGNOSIS — E1165 Type 2 diabetes mellitus with hyperglycemia: Secondary | ICD-10-CM

## 2018-03-23 NOTE — Telephone Encounter (Signed)
Left vm for pt to return call to clinic -EH/RMA  

## 2018-03-23 NOTE — Telephone Encounter (Signed)
Pt left vm requesting refills of metformin.  He stated that he has lost his insurance and that he will have some new insurance in January. He is wanting refills until then. Please advise. -EH/RMA

## 2018-03-23 NOTE — Telephone Encounter (Signed)
He has not had lab work in over 1 year. I cannot safely refill his medication without at least having kidney function checked. I will provide enough medication until the new year as soon as I have the result of his labs.

## 2018-03-25 ENCOUNTER — Other Ambulatory Visit: Payer: Self-pay

## 2018-03-25 DIAGNOSIS — E1165 Type 2 diabetes mellitus with hyperglycemia: Secondary | ICD-10-CM

## 2018-03-25 MED ORDER — METFORMIN HCL ER 500 MG PO TB24: 1000.0000 mg | ORAL_TABLET | Freq: Two times a day (BID) | ORAL | 0 refills | Status: DC

## 2018-04-29 HISTORY — PX: CORONARY ARTERY BYPASS GRAFT: SHX141

## 2018-05-15 ENCOUNTER — Ambulatory Visit: Payer: Commercial Managed Care - PPO | Admitting: Physician Assistant

## 2018-06-29 ENCOUNTER — Ambulatory Visit: Payer: BLUE CROSS/BLUE SHIELD | Admitting: Physician Assistant

## 2018-06-29 ENCOUNTER — Encounter: Payer: Self-pay | Admitting: Physician Assistant

## 2018-06-29 VITALS — BP 127/79 | HR 73 | Wt 178.0 lb

## 2018-06-29 DIAGNOSIS — Z0100 Encounter for examination of eyes and vision without abnormal findings: Secondary | ICD-10-CM

## 2018-06-29 DIAGNOSIS — E1165 Type 2 diabetes mellitus with hyperglycemia: Secondary | ICD-10-CM | POA: Diagnosis not present

## 2018-06-29 DIAGNOSIS — E1142 Type 2 diabetes mellitus with diabetic polyneuropathy: Secondary | ICD-10-CM

## 2018-06-29 DIAGNOSIS — E119 Type 2 diabetes mellitus without complications: Secondary | ICD-10-CM

## 2018-06-29 DIAGNOSIS — Z5181 Encounter for therapeutic drug level monitoring: Secondary | ICD-10-CM | POA: Diagnosis not present

## 2018-06-29 DIAGNOSIS — E1169 Type 2 diabetes mellitus with other specified complication: Secondary | ICD-10-CM | POA: Diagnosis not present

## 2018-06-29 DIAGNOSIS — E785 Hyperlipidemia, unspecified: Secondary | ICD-10-CM | POA: Diagnosis not present

## 2018-06-29 LAB — POCT GLYCOSYLATED HEMOGLOBIN (HGB A1C): HbA1c, POC (controlled diabetic range): 7 % (ref 0.0–7.0)

## 2018-06-29 LAB — POCT UA - MICROALBUMIN
CREATININE, POC: 50 mg/dL
Microalbumin Ur, POC: 10 mg/L

## 2018-06-29 MED ORDER — EMPAGLIFLOZIN-METFORMIN HCL ER 5-1000 MG PO TB24: 2.0000 | ORAL_TABLET | Freq: Every day | ORAL | 1 refills | Status: DC

## 2018-06-29 MED ORDER — NORTRIPTYLINE HCL 10 MG PO CAPS: ORAL_CAPSULE | ORAL | 0 refills | Status: DC

## 2018-06-29 NOTE — Progress Notes (Signed)
HPI:                                                                Zachary Harvey is a 58 y.o. 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  DMII: taking Metformin ER daily. He has been compliant with his medications.  Denies polydipsia, polyuria, polyphagia. Reports blurred vision about once daily, so he will eat or drink something sweet. He frequently skips meals due to his job. He does endorse intermittent burning sensations on plantar aspect of both feet. Denies numbness. Denies ulcers/wounds on feet. Hx of DKA/HHS: no Diabetes associated symptoms: heart disease, skin tags Glucometer: rarely uses it Blood glucose readings: no readings to report  Hypoglycemia frequency: n/a Severe hypoglycemia (requiring 3rd party assistance): n/a  CAD/HLD: he is followed by Cardiology, Dr. Clovia Cuff. Reports he is no longer performing any heavy lifting, so he is no longer having exertional chest pain at work. Per last office note dated 4/17/9 "patient has CAD and is s/p PTCA balloon angioplasty to a small left PDA in 2012. Previous ESE and DSE stress tests in 2017 were negative for coronary ischemia. His DSE earlier this month demonstrated preserved LV function and no evidence for inducible ischemia. He has small vessel disease and should be on a long acting nitrate and carry NTG. He is currently in CC-1 for stable angina. No clinical evidence of CHF or arrhythmia. We should stay the course with the current medical regimen. No indication for an invasive cardiovascular strategy at this time."   Past Medical History:  Diagnosis Date  . Allergy   . BPPV (benign paroxysmal positional vertigo)   . CAD (coronary artery disease)   . Combined nonsenile cataract 01/09/2017  . Dermatochalasis 01/09/2017   Observation per Dr. Larose Kells  . Diabetes  mellitus without complication ([place])   . GERD (gastroesophageal reflux disease)   . Hyperlipidemia   . Hypertension   . Myocardial infarction ([place])   . Posterior vitreous detachment of left eye 01/09/2017   Past Surgical History:  Procedure Laterality Date  . CORONARY/GRAFT ACUTE MI REVASCULARIZATION  2012   Social History   Tobacco Use  . Smoking status: Never Smoker  . Smokeless tobacco: Never Used  Substance Use Topics  . Alcohol use: No   family history includes Diabetes in his mother; Heart disease in his mother; Kidney disease in his mother.    Review of Systems  Eyes: Positive for photophobia and visual disturbance (blurred).  Cardiovascular: Positive for chest pain (CAD).  Musculoskeletal: Positive for back pain (LBP).  Neurological: Positive for dizziness and headaches (mild).       + burning sensation in bottom of both feet  All other systems reviewed and are negative.    Medications: Current Outpatient Medications  Medication Sig Dispense Refill  . AMBULATORY NON FORMULARY MEDICATION Single glucometer with lancets, test strips 1 each 0  . aspirin 81 MG tablet Take 81 mg by mouth daily.    Marland Kitchen atorvastatin (LIPITOR) 80 MG tablet Take 80 mg by mouth daily.    . blood glucose meter kit and supplies KIT Check morning fasting  glucose daily and up to four times daily as needed 1 each 0  . Empagliflozin-metFORMIN HCl ER (SYNJARDY XR) 08-998 MG TB24 Take 2 tablets by mouth daily with breakfast. 180 tablet 1  . isosorbide mononitrate (IMDUR) 30 MG 24 hr tablet Take 30 mg by mouth.    . metoprolol tartrate (LOPRESSOR) 25 MG tablet Take 25 mg by mouth 2 (two) times daily.    . MULTIPLE VITAMIN PO Take by mouth.    . nitroGLYCERIN (NITROSTAT) 0.4 MG SL tablet Place 1 tablet (0.4 mg total) under the tongue every 5 (five) minutes as needed for chest pain. 5 tablet 3  . nortriptyline (PAMELOR) 10 MG capsule Take 1 capsule (10 mg total) by mouth at bedtime for 7 days, THEN 2  capsules (20 mg total) at bedtime for 23 days. 60 capsule 0   No current facility-administered medications for this visit.    No Known Allergies     Objective:  BP 127/79   Pulse 73   Wt 178 lb (80.7 kg)   BMI 27.88 kg/m  Gen:  alert, not ill-appearing, no distress, appropriate for age 56: head normocephalic without obvious abnormality, conjunctiva and cornea clear, wearing glasses, trachea midline Pulm: Normal work of breathing, normal phonation, clear to auscultation bilaterally, no wheezes, rales or rhonchi CV: Normal rate, regular rhythm, s1 and s2 distinct, no murmurs, clicks or rubs  Neuro: alert and oriented x 3, no tremor MSK: extremities atraumatic, normal gait and station Skin: intact, multiple skin tags of upper chest wall Psych: well-groomed, cooperative, good eye contact, euthymic mood, affect mood-congruent, speech is articulate, and thought processes clear and goal-directed    Results for orders placed or performed in visit on 06/29/18 (from the past 72 hour(s))  POCT HgB A1C     Status: None   Collection Time: 06/29/18  9:16 AM  Result Value Ref Range   Hemoglobin A1C     HbA1c POC (<> result, manual entry)     HbA1c, POC (prediabetic range)     HbA1c, POC (controlled diabetic range) 7.0 0.0 - 7.0 %  POCT UA - Microalbumin     Status: Normal   Collection Time: 06/29/18  9:16 AM  Result Value Ref Range   Microalbumin Ur, POC 10 mg/L   Creatinine, POC 50 mg/dL   Albumin/Creatinine Ratio, Urine, POC <30    No results found.    Assessment and Plan: 58 y.o. male with   Uncontrolled type 2 diabetes mellitus with hyperglycemia ([place]) - Plan: POCT HgB A1C, POCT UA - Microalbumin, COMPLETE METABOLIC PANEL WITH GFR, CBC  Hyperlipidemia associated with type 2 diabetes mellitus ([place]) - Plan: POCT HgB A1C, POCT UA - Microalbumin, Lipid Panel w/reflex Direct LDL  Diabetic eye exam ([place]) - Plan: Ambulatory referral to Ophthalmology  Encounter for medication  monitoring - Plan: Lipid Panel w/reflex Direct LDL, COMPLETE METABOLIC PANEL WITH GFR, CBC  Diabetic polyneuropathy associated with type 2 diabetes mellitus ([facility])  A1c is 7.0, improved from 7.9 with increased dose of Metformin I would like to switch him to [place] for cardiovascular benefit if possible He is exhibiting early symptoms of peripheral neuropathy Starting Nortryptiline, self-titrate to 20 mg QHS. Counseled on increased risk of sedation/CNS depression and caution when taking this while driving Pneumovax UTD Eye exam overdue, due August 2019, counseled to schedule Checking lipid panel today, continue high-intensity statin BP at goal, continue Lopressor. Cardiology is managing his ischemic cardiomyopathy. He was reminded to schedule his f/u appointment for April  Patient education and anticipatory guidance given Patient agrees with treatment plan Follow-up in 3 months for CPE or sooner as needed if symptoms worsen or fail to improve  I spent 40 minutes with this patient, greater than 50% was face-to-face time counseling regarding the above diagnoses  Darlyne Russian PA-C

## 2018-06-29 NOTE — Patient Instructions (Addendum)
Schedule Cardiology follow-up appointment Schedule annual diabetic eye exam  Sign up for prescription savings [email or web address]  Start Nortryptiline at bedtime for neuropathy (burning foot pain)  Diabetes Preventive Care: - annual foot exam  - annual dilated eye exam with an eye doctor - self foot exams at least weekly - pneumonia vaccine once (booster in 5 years and at age 89) - annual influenza vaccine - twice yearly dental cleanings and yearly exam - goal blood pressure <140/90, ideally <130/80 - LDL cholesterol <70 - A1C <1.6<7.0 - body mass index (BMI) <25.0 - follow-up every 3 months if your A1C is not at goal - follow-up every 6 months if diabetes is well controlled   Diabetic Neuropathy Diabetic neuropathy refers to nerve damage that is caused by diabetes (diabetes mellitus). Over time, people with diabetes can develop nerve damage throughout the body. There are several types of diabetic neuropathy:  Peripheral neuropathy. This is the most common type of diabetic neuropathy. It causes damage to nerves that carry signals between the spinal cord and other parts of the body (peripheral nerves). This usually affects nerves in the feet and legs first, and may eventually affect the hands and arms. The damage affects the ability to sense touch or temperature.  Autonomic neuropathy. This type causes damage to nerves that control involuntary functions (autonomic nerves). These nerves carry signals that control: ? Heartbeat. ? Body temperature. ? Blood pressure. ? Urination. ? Digestion. ? Sweating. ? Sexual function. ? Response to changing blood sugar (glucose) levels.  Focal neuropathy. This type of nerve damage affects one area of the body, such as an arm, a leg, or the face. The injury may involve one nerve or a small group of nerves. Focal neuropathy can be painful and unpredictable, and occurs most often in older adults with diabetes. This often  develops suddenly, but usually improves over time and does not cause long-term problems.  Proximal neuropathy. This type of nerve damage affects the nerves of the thighs, hips, buttocks, or legs. It causes severe pain, weakness, and muscle death (atrophy), usually in the thigh muscles. It is more common among older men and people who have type 2 diabetes. The length of recovery time may vary. What are the causes? Peripheral, autonomic, and focal neuropathies are caused by diabetes that is not well controlled with treatment. The cause of proximal neuropathy is not known, but it may be caused by inflammation related to uncontrolled blood glucose levels. What are the signs or symptoms? Peripheral neuropathy Peripheral neuropathy develops slowly over time. When the nerves of the feet and legs no longer work, you may experience:  Burning, stabbing, or aching pain in the legs or feet.  Pain or cramping in the legs or feet.  Loss of feeling (numbness) and inability to feel pressure or pain in the feet. This can lead to: ? Thick calluses or sores on areas of constant pressure. ? Ulcers. ? Reduced ability to feel temperature changes.  Foot deformities.  Muscle weakness.  Loss of balance or coordination. Autonomic neuropathy The symptoms of autonomic neuropathy vary depending on which nerves are affected. Symptoms may include:  Problems with digestion, such as: ? Nausea or vomiting. ? Poor appetite. ? Bloating. ? Diarrhea or constipation. ? Trouble swallowing. ? Losing weight without trying to.  Problems with the heart, blood and lungs, such as: ? Dizziness, especially when standing up. ? Fainting. ? Shortness of breath. ? Irregular heartbeat.  Bladder problems, such as: ? Trouble starting or stopping urination. ?  Leaking urine. ? Trouble emptying the bladder. ? Urinary tract infections (UTIs).  Problems with other body functions, such as: ? Sweat. You may sweat too much or too  little. ? Temperature. You might get hot easily. Or, you might feel cold more than usual. ? Sexual function. Men may not be able to get or maintain an erection. Women may have vaginal dryness and difficulty with arousal. Focal neuropathy Symptoms affect only one area of the body. Common symptoms include:  Numbness.  Tingling.  Burning pain.  Prickling feeling.  Very sensitive skin.  Weakness.  Inability to move (paralysis).  Muscle twitching.  Muscles getting smaller (wasting).  Poor coordination.  Double or blurred vision. Proximal neuropathy  Sudden, severe pain in the hip, thigh, or buttocks. Pain may spread from the back into the legs (sciatica).  Pain and numbness in the arms and legs.  Tingling.  Loss of bladder control or bowel control.  Weakness and wasting of thigh muscles.  Difficulty getting up from a seated position.  Abdominal swelling.  Unexplained weight loss. How is this diagnosed? Diagnosis usually involves reviewing your medical history and any symptoms you have. Diagnosis varies depending on the type of neuropathy your health care provider suspects. Peripheral neuropathy Your health care provider will check areas that are affected by your nervous system (neurologic exam), such as your reflexes, how you move, and what you can feel. You may have other tests, such as:  Blood tests.  Removal and examination of fluid that surrounds the spinal cord (lumbar puncture).  CT scan.  MRI.  A test to check the nerves that control muscles (electromyogram, EMG).  Tests of how quickly messages pass through your nerves (nerve conduction velocity tests).  Removal of a small piece of nerve to be examined under a microscope (biopsy). Autonomic neuropathy You may have tests, such as:  Tests to measure your blood pressure and heart rate. This may include monitoring you while you are safely secured to an exam table that moves you from a lying position to  an upright position (table tilt test).  Breathing tests to check your lungs.  Tests to check how food moves through the digestive system (gastric emptying tests).  Blood, sweat, or urine tests.  Ultrasound of your bladder.  Spinal fluid tests. Focal neuropathy This condition may be diagnosed with:  A neurologic exam.  CT scan.  MRI.  EMG.  Nerve conduction velocity tests. Proximal neuropathy There is no test to diagnose this type of neuropathy. You may have tests to rule out other possible causes of this type of neuropathy. Tests may include:  X-rays of your spine and lumbar region.  Lumbar puncture.  MRI. How is this treated? The goal of treatment is to keep nerve damage from getting worse. The most important part of treatment is keeping your blood glucose level and your A1C level within your target range by following your diabetes management plan. Over time, maintaining lower blood glucose levels helps lessen symptoms. In some cases, you may need prescription pain medicine. Follow these instructions at home:  Lifestyle   Do not use any products that contain nicotine or tobacco, such as cigarettes and e-cigarettes. If you need help quitting, ask your health care provider.  Be physically active every day. Include strength training and balance exercises.  Follow a healthy meal plan.  Work with your health care provider to manage your blood pressure. General instructions  Follow your diabetes management plan as directed. ? Check your blood glucose  levels as directed by your health care provider. ? Keep your blood glucose in your target range as directed by your health care provider. ? Have your A1C level checked at least two times a year, or as often as told by your health care provider.  Take over the counter and prescription medicines only as told by your health care provider. This includes insulin and diabetes medicine.  Do not drive or use heavy machinery while  taking prescription pain medicines.  Check your skin and feet every day for cuts, bruises, redness, blisters, or sores.  Keep all follow up visits as told by your health care provider. This is important. Contact a health care provider if:  You have burning, stabbing, or aching pain in your legs or feet.  You are unable to feel pressure or pain in your feet.  You develop problems with digestion, such as: ? Nausea. ? Vomiting. ? Bloating. ? Constipation. ? Diarrhea. ? Abdominal pain.  You have difficulty with urination, such as inability: ? To control when you urinate (incontinence). ? To completely empty the bladder (retention).  You have palpitations.  You feel dizzy, weak, or faint when you stand up. Get help right away if:  You cannot urinate.  You have sudden weakness or loss of coordination.  You have trouble speaking.  You have pain or pressure in your chest.  You have an irregular heart beat.  You have sudden inability to move a part of your body. Summary  Diabetic neuropathy refers to nerve damage that is caused by diabetes. It can affect nerves throughout the entire body, causing numbness and pain in the arms, legs, digestive tract, heart, and other body systems.  Keep your blood glucose level and your blood pressure in your target range, as directed by your health care provider. This can help prevent neuropathy from getting worse.  Check your skin and feet every day for cuts, bruises, redness, blisters, or sores.  Do not use any products that contain nicotine or tobacco, such as cigarettes and e-cigarettes. If you need help quitting, ask your health care provider. This information is not intended to replace advice given to you by your health care provider. Make sure you discuss any questions you have with your health care provider. Document Released: 06/24/2001 Document Revised: 05/28/2017 Document Reviewed: 05/20/2016 Elsevier Interactive Patient Education   2019 [employer].

## 2018-06-30 ENCOUNTER — Encounter: Payer: Self-pay | Admitting: Physician Assistant

## 2018-06-30 ENCOUNTER — Other Ambulatory Visit: Payer: Self-pay | Admitting: Physician Assistant

## 2018-06-30 DIAGNOSIS — R740 Nonspecific elevation of levels of transaminase and lactic acid dehydrogenase [LDH]: Principal | ICD-10-CM

## 2018-06-30 DIAGNOSIS — R7401 Elevation of levels of liver transaminase levels: Secondary | ICD-10-CM | POA: Insufficient documentation

## 2018-06-30 HISTORY — DX: Elevation of levels of liver transaminase levels: R74.01

## 2018-06-30 LAB — COMPLETE METABOLIC PANEL WITH GFR
AG Ratio: 1.2 (calc) (ref 1.0–2.5)
ALKALINE PHOSPHATASE (APISO): 89 U/L (ref 35–144)
ALT: 61 U/L — AB (ref 9–46)
AST: 36 U/L — ABNORMAL HIGH (ref 10–35)
Albumin: 4.1 g/dL (ref 3.6–5.1)
BILIRUBIN TOTAL: 0.4 mg/dL (ref 0.2–1.2)
BUN: 14 mg/dL (ref 7–25)
CHLORIDE: 104 mmol/L (ref 98–110)
CO2: 23 mmol/L (ref 20–32)
CREATININE: 0.84 mg/dL (ref 0.70–1.33)
Calcium: 9.6 mg/dL (ref 8.6–10.3)
GFR, Est African American: 114 mL/min/{1.73_m2} (ref >=60)
GFR, Est Non African American: 99 mL/min/{1.73_m2} (ref >=60)
GLUCOSE: 226 mg/dL — AB (ref 65–99)
Globulin: 3.3 g/dL (calc) (ref 1.9–3.7)
Potassium: 3.8 mmol/L (ref 3.5–5.3)
Sodium: 137 mmol/L (ref 135–146)
Total Protein: 7.4 g/dL (ref 6.1–8.1)

## 2018-06-30 LAB — CBC
HCT: 43 % (ref 38.5–50.0)
Hemoglobin: 14.2 g/dL (ref 13.2–17.1)
MCH: 29.2 pg (ref 27.0–33.0)
MCHC: 33 g/dL (ref 32.0–36.0)
MCV: 88.5 fL (ref 80.0–100.0)
MPV: 11.2 fL (ref 7.5–12.5)
PLATELETS: 288 10*3/uL (ref 140–400)
RBC: 4.86 10*6/uL (ref 4.20–5.80)
RDW: 13.1 % (ref 11.0–15.0)
WBC: 6.5 10*3/uL (ref 3.8–10.8)

## 2018-06-30 LAB — LIPID PANEL W/REFLEX DIRECT LDL
Cholesterol: 138 mg/dL (ref <200)
HDL: 37 mg/dL — ABNORMAL LOW (ref >=40)
LDL CHOLESTEROL (CALC): 68 mg/dL
NON-HDL CHOLESTEROL (CALC): 101 mg/dL (ref <130)
TRIGLYCERIDES: 253 mg/dL — AB (ref <150)
Total CHOL/HDL Ratio: 3.7 (calc) (ref <5.0)

## 2018-07-01 ENCOUNTER — Telehealth: Payer: Self-pay

## 2018-07-01 DIAGNOSIS — E119 Type 2 diabetes mellitus without complications: Secondary | ICD-10-CM

## 2018-07-01 MED ORDER — METFORMIN HCL ER 500 MG PO TB24: 1000.0000 mg | ORAL_TABLET | Freq: Two times a day (BID) | ORAL | 0 refills | Status: DC

## 2018-07-01 NOTE — Telephone Encounter (Signed)
Metformin sent to bridge patient until PA is approved

## 2018-07-01 NOTE — Telephone Encounter (Signed)
Pt's wife advised.

## 2018-07-01 NOTE — Telephone Encounter (Signed)
Pt's wife states that a PA is needed on the new RX sent to pharmacy for [place].   Pt's wife reports that pt will be out of his Metformin by Friday, wanting to know if she can be contacted with an update on PA/wants to see if a short term supply could be called in of his old Metformin to hold him over?  Francesco Runner, can you please look into this and contact pt's wife? Thanks!

## 2018-07-01 NOTE — Telephone Encounter (Signed)
I am working on the [place]. Patients wife is asking for a temporary supply of Metformin until we can get the PA which can take up to 15 business days. I have let the patient's wife know about the process.  Please advise if ok to send in short term supply of metformin?

## 2018-08-05 ENCOUNTER — Other Ambulatory Visit: Payer: Self-pay | Admitting: Physician Assistant

## 2018-08-05 DIAGNOSIS — E119 Type 2 diabetes mellitus without complications: Secondary | ICD-10-CM

## 2018-09-05 ENCOUNTER — Other Ambulatory Visit: Payer: Self-pay | Admitting: Physician Assistant

## 2018-09-05 DIAGNOSIS — E119 Type 2 diabetes mellitus without complications: Secondary | ICD-10-CM

## 2018-09-29 ENCOUNTER — Encounter: Payer: BLUE CROSS/BLUE SHIELD | Admitting: Physician Assistant

## 2018-10-13 ENCOUNTER — Other Ambulatory Visit: Payer: Self-pay | Admitting: Physician Assistant

## 2018-10-13 DIAGNOSIS — E119 Type 2 diabetes mellitus without complications: Secondary | ICD-10-CM

## 2018-11-02 ENCOUNTER — Ambulatory Visit (INDEPENDENT_AMBULATORY_CARE_PROVIDER_SITE_OTHER): Payer: BC Managed Care – PPO | Admitting: Physician Assistant

## 2018-11-02 ENCOUNTER — Telehealth: Payer: Self-pay | Admitting: *Deleted

## 2018-11-02 ENCOUNTER — Encounter: Payer: Self-pay | Admitting: Physician Assistant

## 2018-11-02 ENCOUNTER — Other Ambulatory Visit: Payer: Self-pay

## 2018-11-02 VITALS — Temp 98.0°F

## 2018-11-02 DIAGNOSIS — R0781 Pleurodynia: Secondary | ICD-10-CM

## 2018-11-02 DIAGNOSIS — R05 Cough: Secondary | ICD-10-CM | POA: Diagnosis not present

## 2018-11-02 DIAGNOSIS — Z20828 Contact with and (suspected) exposure to other viral communicable diseases: Secondary | ICD-10-CM | POA: Diagnosis not present

## 2018-11-02 DIAGNOSIS — R059 Cough, unspecified: Secondary | ICD-10-CM

## 2018-11-02 DIAGNOSIS — Z20822 Contact with and (suspected) exposure to covid-19: Secondary | ICD-10-CM

## 2018-11-02 NOTE — Telephone Encounter (Signed)
-----   Message from [facility], [place] sent at 11/02/2018 11:17 AM EDT ----- Please schedule patient for COVID testing

## 2018-11-02 NOTE — Progress Notes (Addendum)
Virtual Visit via Telephone  I connected with Zachary Harvey on 11/02/18 at 10:30 AM EDT by telephone and verified that I am speaking with the correct person using two identifiers.   I discussed the limitations of evaluation and management by telemedicine and the availability of in person appointments. The patient expressed understanding and agreed to proceed.  History of Present Illness: HPI:                                                                Zachary Harvey is a 58 y.o. male   CC: cough  Cough This is a new problem. The current episode started in the past 7 days (Saturday). The problem has been gradually worsening. The problem occurs every few minutes. The cough is non-productive. Associated symptoms include chest pain (pleuritic), chills, headaches, myalgias and a sore throat. Pertinent negatives include no fever, shortness of breath or wheezing. Nothing aggravates the symptoms. 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 He has tried OTC cough suppressant (Coricidin 2 every 6 hours) for the symptoms. The treatment provided mild relief. There is no history of asthma or COPD.      Past Medical History:  Diagnosis Date  . Allergy   . BPPV (benign paroxysmal positional vertigo)   . CAD (coronary artery disease)   . Combined nonsenile cataract 01/09/2017  . Dermatochalasis 01/09/2017   Observation per Dr. Larose Kells  . Diabetes mellitus without complication ([place])   . GERD (gastroesophageal reflux disease)   . Hyperlipidemia   . Hypertension   . Myocardial infarction ([place])   . Posterior vitreous detachment of left eye 01/09/2017  . Transaminitis 06/30/2018   Past Surgical History:  Procedure Laterality Date  . CORONARY/GRAFT ACUTE MI REVASCULARIZATION  2012   Social History   Tobacco Use  . Smoking status: Never Smoker  . Smokeless tobacco: Never Used  Substance Use Topics  . Alcohol use: No   family history includes  Diabetes in his mother; Heart disease in his mother; Kidney disease in his mother.    ROS: negative except as noted in the HPI  Medications: Current Outpatient Medications  Medication Sig Dispense Refill  . aspirin 81 MG tablet Take 81 mg by mouth daily.    Marland Kitchen atorvastatin (LIPITOR) 80 MG tablet Take 80 mg by mouth daily.    . isosorbide mononitrate (IMDUR) 30 MG 24 hr tablet Take 30 mg by mouth.    . metFORMIN (GLUCOPHAGE-XR) 500 MG 24 hr tablet Take 2 tablets (1,000 mg total) by mouth 2 (two) times daily with a meal. 2 WEEK SUPPLY GIVEN. MUST SCHEDULE/KEEP APPOINTMENT FOR REFILLS 60 tablet 0  . metoprolol tartrate (LOPRESSOR) 25 MG tablet Take 25 mg by mouth 2 (two) times daily.    . MULTIPLE VITAMIN PO Take by mouth.    . nitroGLYCERIN (NITROSTAT) 0.4 MG SL tablet Place 1 tablet (0.4 mg total) under the tongue every 5 (five) minutes as needed for chest pain. 5 tablet 3  . AMBULATORY NON FORMULARY MEDICATION Single glucometer with lancets, test strips 1 each 0  . blood glucose meter kit and supplies KIT Check morning fasting glucose daily and up to four times daily as needed 1 each 0  .  Empagliflozin-metFORMIN HCl ER (SYNJARDY XR) 08-998 MG TB24 Take 2 tablets by mouth daily with breakfast. (Patient not taking: Reported on 11/02/2018) 180 tablet 1  . nortriptyline (PAMELOR) 10 MG capsule Take 1 capsule (10 mg total) by mouth at bedtime for 7 days, THEN 2 capsules (20 mg total) at bedtime for 23 days. 60 capsule 0   No current facility-administered medications for this visit.    No Known Allergies     Objective:  Temp 98 F (36.7 C) (Oral)  Pulm: Normal work of breathing, normal phonation, speaking in full sentences, frequent cough Neuro: alert and oriented x 3  No results found for this or any previous visit (from the past 72 hour(s)). No results found.    Assessment and Plan: 58 y.o. male with   .Zachary Harvey was seen today for cough.  Diagnoses and all orders for this  visit:  Suspected Covid-19 Virus Infection  Cough in adult  Pleuritic chest pain    Patient provided vital signs reviewed. He is afebrile. Unable to provide BP/HR. 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 The patient was provided an opportunity to ask questions and all were answered. The patient agreed with the plan and demonstrated an understanding of the instructions.   The patient was advised to call back or seek an in-person evaluation if the symptoms worsen or if the condition fails to improve as anticipated.  I provided 10 minutes of non-face-to-face time during this encounter.   Trixie Dredge, [place]

## 2018-11-02 NOTE — Telephone Encounter (Signed)
Pt scheduled for covid testing 11/03/18 @ [facility] @ 10:30. Instructions given and order placed

## 2018-11-03 ENCOUNTER — Other Ambulatory Visit: Payer: Self-pay

## 2018-11-03 ENCOUNTER — Telehealth: Payer: Self-pay

## 2018-11-03 DIAGNOSIS — Z20822 Contact with and (suspected) exposure to covid-19: Secondary | ICD-10-CM

## 2018-11-03 DIAGNOSIS — R6889 Other general symptoms and signs: Secondary | ICD-10-CM | POA: Diagnosis not present

## 2018-11-03 NOTE — Telephone Encounter (Signed)
Pt is requesting work note explaining that he is being tested for COVID and that he is awaiting results. Please advise. -EH/RMA

## 2018-11-03 NOTE — Progress Notes (Signed)
Letter printed and faxed -EH/RMA  

## 2018-11-04 NOTE — Telephone Encounter (Signed)
Letter printed and faxed -EH/RMA

## 2018-11-08 LAB — NOVEL CORONAVIRUS, NAA: SARS-CoV-2, NAA: DETECTED — AB

## 2018-11-09 ENCOUNTER — Encounter: Payer: Self-pay | Admitting: Physician Assistant

## 2018-11-09 ENCOUNTER — Ambulatory Visit (INDEPENDENT_AMBULATORY_CARE_PROVIDER_SITE_OTHER): Payer: BC Managed Care – PPO | Admitting: Physician Assistant

## 2018-11-09 VITALS — Temp 96.6°F

## 2018-11-09 DIAGNOSIS — J988 Other specified respiratory disorders: Secondary | ICD-10-CM | POA: Diagnosis not present

## 2018-11-09 DIAGNOSIS — U071 COVID-19: Secondary | ICD-10-CM

## 2018-11-09 DIAGNOSIS — E119 Type 2 diabetes mellitus without complications: Secondary | ICD-10-CM | POA: Diagnosis not present

## 2018-11-09 DIAGNOSIS — I259 Chronic ischemic heart disease, unspecified: Secondary | ICD-10-CM

## 2018-11-09 MED ORDER — BENZONATATE 200 MG PO CAPS: 200.0000 mg | ORAL_CAPSULE | Freq: Three times a day (TID) | ORAL | 0 refills | Status: DC | PRN

## 2018-11-09 MED ORDER — NITROGLYCERIN 0.4 MG SL SUBL: SUBLINGUAL_TABLET | SUBLINGUAL | 3 refills | Status: DC

## 2018-11-09 MED ORDER — METFORMIN HCL ER 500 MG PO TB24: 1000.0000 mg | ORAL_TABLET | Freq: Two times a day (BID) | ORAL | 0 refills | Status: DC

## 2018-11-09 NOTE — Progress Notes (Signed)
Virtual Visit via Telephone Note  I connected with Zachary Harvey on 11/09/18 at  2:20 PM EDT by telephone and verified that I am speaking with the correct person using two identifiers.   I discussed the limitations of evaluation and management by telemedicine and the availability of in person appointments. The patient expressed understanding and agreed to proceed.  History of Present Illness: HPI:                                                                Zachary Harvey is a 58 y.o. male   CC: COVID-19   Onset: 9 days ago Reports symptoms are gradually improving. He states he still has intermittent mild headaches and nonproductive cough. Denies SOB or breathing difficulty. He reports he has chest pain when he coughs a lot, but he states it is not angina. He has not needed to take Nitro. He has an appt with his Cardiologist next week. He states appetite is normal and he is tolerating PO, drinking green tea and cinnamon tea. He endorses weakness in his legs. He states "when I walk, I feel like I can't hold my own weight, especially in the mornings." Denies myalgia or falls. Legs feel weaker than usual. Continues to deny fever. States home temp has not been above 98. He continues to take Coricidin every 6 hours and Mucinex every 12 hours.  He is also requesting refills of Metformin and Nitro.  Past Medical History:  Diagnosis Date  . Allergy   . BPPV (benign paroxysmal positional vertigo)   . CAD (coronary artery disease)   . Combined nonsenile cataract 01/09/2017  . Dermatochalasis 01/09/2017   Observation per Dr. Larose Kells  . Diabetes mellitus without complication ([place])   . GERD (gastroesophageal reflux disease)   . Hyperlipidemia   . Hypertension   . Myocardial infarction ([place])   . Posterior vitreous detachment of left eye 01/09/2017  . Transaminitis 06/30/2018   Past Surgical History:  Procedure Laterality Date  . CORONARY/GRAFT ACUTE MI REVASCULARIZATION  2012   Social  History   Tobacco Use  . Smoking status: Never Smoker  . Smokeless tobacco: Never Used  Substance Use Topics  . Alcohol use: No   family history includes Diabetes in his mother; Heart disease in his mother; Kidney disease in his mother.    ROS: negative except as noted in the HPI  Medications: Current Outpatient Medications  Medication Sig Dispense Refill  . AMBULATORY NON FORMULARY MEDICATION Single glucometer with lancets, test strips 1 each 0  . aspirin 81 MG tablet Take 81 mg by mouth daily.    Marland Kitchen atorvastatin (LIPITOR) 80 MG tablet Take 80 mg by mouth daily.    . blood glucose meter kit and supplies KIT Check morning fasting glucose daily and up to four times daily as needed 1 each 0  . isosorbide mononitrate (IMDUR) 30 MG 24 hr tablet Take 30 mg by mouth.    . metFORMIN (GLUCOPHAGE-XR) 500 MG 24 hr tablet Take 2 tablets (1,000 mg total) by mouth 2 (two) times daily with a meal. 2 WEEK SUPPLY GIVEN. MUST SCHEDULE/KEEP APPOINTMENT FOR REFILLS 60 tablet 0  . metoprolol tartrate (LOPRESSOR) 25 MG tablet Take 25 mg by mouth 2 (two) times daily.    Marland Kitchen  MULTIPLE VITAMIN PO Take by mouth.    . nitroGLYCERIN (NITROSTAT) 0.4 MG SL tablet Place 1 tablet (0.4 mg total) under the tongue every 5 (five) minutes as needed for chest pain. 5 tablet 3  . nortriptyline (PAMELOR) 10 MG capsule Take 1 capsule (10 mg total) by mouth at bedtime for 7 days, THEN 2 capsules (20 mg total) at bedtime for 23 days. 60 capsule 0   No current facility-administered medications for this visit.    No Known Allergies     Objective:  There were no vitals taken for this visit. BP Readings from Last 3 Encounters:  06/29/18 127/79  10/03/17 113/71  09/20/16 125/77  Pulm: normal work of breathing, speaking in full sentence, frequent cough  No results found for this or any previous visit (from the past 72 hour(s)). No results found. Recent Results (from the past 2160 hour(s))  Novel Coronavirus, NAA ([facility])      Status: Abnormal   Collection Time: 11/03/18  9:08 AM  Result Value Ref Range   SARS-CoV-2, NAA Detected (A) Not Detected    Comment: Testing was performed using the cobas(R) SARS-CoV-2 test. This test was developed and its performance characteristics determined by [employer]. This test has not been FDA cleared or approved. This test has been authorized by FDA under an Emergency Use Authorization (EUA). This test is only authorized for the duration of time the declaration that circumstances exist justifying the authorization of the emergency use of in vitro diagnostic tests for detection of SARS-CoV-2 virus and/or diagnosis of COVID-19 infection under section 564(b)(1) of the Act, 21 U.S.C. 124PYK-9(X)(8), unless the authorization is terminated or revoked sooner. When diagnostic testing is negative, the possibility of a false negative result should be considered in the context of a patient's recent exposures and the presence of clinical signs and symptoms consistent with COVID-19. An individual without symptoms of COVID-19 and who is not shedding SARS-CoV-2 virus would expect to have a negati ve (not detected) result in this assay.       Assessment and Plan: 58 y.o. male with   .Crue was seen today for results.  Diagnoses and all orders for this visit:  Respiratory tract infection due to COVID-19 virus -     benzonatate (TESSALON) 200 MG capsule; Take 1 capsule (200 mg total) by mouth 3 (three) times daily as needed for cough.  Chronic ischemic heart disease -     nitroGLYCERIN (NITROSTAT) 0.4 MG SL tablet; Dissolve 1 tab under tongue at first sign of angina attack. May repeat Q57mn if needed for a total of 3 tabs in 15 mins; if no relief, seek medical attention promptly  Diabetes mellitus without complication [place] -     metFORMIN (GLUCOPHAGE-XR) 500 MG 24 hr tablet; Take 2 tablets (1,000 mg total) by mouth 2 (two) times daily with a meal.   Patient provided  vital signs reviewed. He is afebrile. Unable to provide BP/HR. On limited physical exam by telephone, he is speaking in full sentences and does not appear in any respiratory distress Based on history, he is clinically improving Patient counseled on supportive care and ED precautions, including worsening chest pain, worsening leg weakness, or dyspnea/SOB Cont Coricidin/Mucinex. Start Tessalon prn for cough. He was also advised to try laying in prone position Risk of [id number] is medium (male, CAD, DM2) Updated work note provided to remain out of work for remainder of the week.  I also advised him to contact his Cardiologist and make him  aware of his diagnosis. Will route note to Dr. Clovia Cuff Keep f/u e-visit on Friday for re-evaluation  Follow Up Instructions:    I discussed the assessment and treatment plan with the patient. The patient was provided an opportunity to ask questions and all were answered. The patient agreed with the plan and demonstrated an understanding of the instructions.   The patient was advised to call back or seek an in-person evaluation if the symptoms worsen or if the condition fails to improve as anticipated.    Trixie Dredge, [place]

## 2018-11-11 DIAGNOSIS — Z7982 Long term (current) use of aspirin: Secondary | ICD-10-CM | POA: Diagnosis not present

## 2018-11-11 DIAGNOSIS — I2510 Atherosclerotic heart disease of native coronary artery without angina pectoris: Secondary | ICD-10-CM | POA: Diagnosis not present

## 2018-11-11 DIAGNOSIS — Z7984 Long term (current) use of oral hypoglycemic drugs: Secondary | ICD-10-CM | POA: Diagnosis not present

## 2018-11-11 DIAGNOSIS — G4733 Obstructive sleep apnea (adult) (pediatric): Secondary | ICD-10-CM | POA: Diagnosis not present

## 2018-11-11 DIAGNOSIS — R0602 Shortness of breath: Secondary | ICD-10-CM | POA: Diagnosis not present

## 2018-11-11 DIAGNOSIS — K219 Gastro-esophageal reflux disease without esophagitis: Secondary | ICD-10-CM | POA: Diagnosis not present

## 2018-11-11 DIAGNOSIS — Z955 Presence of coronary angioplasty implant and graft: Secondary | ICD-10-CM | POA: Diagnosis not present

## 2018-11-11 DIAGNOSIS — R509 Fever, unspecified: Secondary | ICD-10-CM | POA: Diagnosis not present

## 2018-11-11 DIAGNOSIS — R05 Cough: Secondary | ICD-10-CM | POA: Diagnosis not present

## 2018-11-11 DIAGNOSIS — I10 Essential (primary) hypertension: Secondary | ICD-10-CM | POA: Diagnosis not present

## 2018-11-11 DIAGNOSIS — Z79899 Other long term (current) drug therapy: Secondary | ICD-10-CM | POA: Diagnosis not present

## 2018-11-11 DIAGNOSIS — U071 COVID-19: Secondary | ICD-10-CM | POA: Diagnosis not present

## 2018-11-11 DIAGNOSIS — I252 Old myocardial infarction: Secondary | ICD-10-CM | POA: Diagnosis not present

## 2018-11-11 DIAGNOSIS — Z6825 Body mass index (BMI) 25.0-25.9, adult: Secondary | ICD-10-CM | POA: Diagnosis not present

## 2018-11-11 DIAGNOSIS — E785 Hyperlipidemia, unspecified: Secondary | ICD-10-CM | POA: Diagnosis not present

## 2018-11-11 DIAGNOSIS — E43 Unspecified severe protein-calorie malnutrition: Secondary | ICD-10-CM | POA: Diagnosis not present

## 2018-11-11 DIAGNOSIS — I259 Chronic ischemic heart disease, unspecified: Secondary | ICD-10-CM | POA: Diagnosis not present

## 2018-11-11 DIAGNOSIS — E119 Type 2 diabetes mellitus without complications: Secondary | ICD-10-CM | POA: Diagnosis not present

## 2018-11-13 ENCOUNTER — Ambulatory Visit: Payer: BC Managed Care – PPO | Admitting: Physician Assistant

## 2018-11-13 ENCOUNTER — Telehealth: Payer: Self-pay | Admitting: Physician Assistant

## 2018-11-13 NOTE — Telephone Encounter (Signed)
Just make sure patient is not charged a No Show. Thanks!

## 2018-11-13 NOTE — Telephone Encounter (Signed)
Patient told me he is in the hospital due to covid symptoms. Took patient off the schedule for 11/13/18. Advised him to call back for a hospital FU over the phone.

## 2018-11-16 DIAGNOSIS — I259 Chronic ischemic heart disease, unspecified: Secondary | ICD-10-CM | POA: Diagnosis not present

## 2018-11-16 DIAGNOSIS — U071 COVID-19: Secondary | ICD-10-CM | POA: Diagnosis not present

## 2018-11-16 DIAGNOSIS — G4733 Obstructive sleep apnea (adult) (pediatric): Secondary | ICD-10-CM | POA: Diagnosis not present

## 2018-11-23 ENCOUNTER — Telehealth: Payer: Self-pay | Admitting: Physician Assistant

## 2018-11-23 ENCOUNTER — Encounter: Payer: Self-pay | Admitting: Physician Assistant

## 2018-11-23 ENCOUNTER — Ambulatory Visit: Payer: BC Managed Care – PPO | Admitting: Physician Assistant

## 2018-11-23 VITALS — BP 135/89 | HR 79

## 2018-11-23 DIAGNOSIS — L290 Pruritus ani: Secondary | ICD-10-CM | POA: Insufficient documentation

## 2018-11-23 DIAGNOSIS — Z09 Encounter for follow-up examination after completed treatment for conditions other than malignant neoplasm: Secondary | ICD-10-CM

## 2018-11-23 DIAGNOSIS — U071 COVID-19: Secondary | ICD-10-CM | POA: Insufficient documentation

## 2018-11-23 DIAGNOSIS — G4483 Primary cough headache: Secondary | ICD-10-CM | POA: Diagnosis not present

## 2018-11-23 DIAGNOSIS — J988 Other specified respiratory disorders: Secondary | ICD-10-CM | POA: Diagnosis not present

## 2018-11-23 MED ORDER — WITCH HAZEL-GLYCERIN EX PADS: 1.0000 "application " | MEDICATED_PAD | CUTANEOUS | 1 refills | Status: DC | PRN

## 2018-11-23 MED ORDER — HYDROCORTISONE ACETATE 25 MG RE SUPP: 25.0000 mg | Freq: Two times a day (BID) | RECTAL | 1 refills | Status: DC | PRN

## 2018-11-23 NOTE — Telephone Encounter (Signed)
Wife left a voicemail to call back to make an appointment for patient for a 2 week follow up. She left her work number for [place] to call back to schedule this appointment. Attempted to call twice with no answer. All I got was a busy dial.

## 2018-11-23 NOTE — Progress Notes (Signed)
Virtual Visit via Telephone Note  I connected with Zachary Harvey on 12/05/18 at 10:30 AM EDT by telephone and verified that I am speaking with the correct person using two identifiers.   I discussed the limitations, risks, security and privacy concerns of performing an evaluation and management service by telephone and the availability of in person appointments. I also discussed with the patient that there may be a patient responsible charge related to this service. The patient expressed understanding and agreed to proceed.   History of Present Illness: HPI:                                                                Zachary Harvey is a 58 y.o. male who presents to [facility] today for hospital discharge follow-up  COVID-19 Positive on 7/7. First symptom on 7/3. He was admitted 7/15-7/18 at [facility].  Continues to endorse a non-productive cough, but states it is getting better. States cough is namely triggered by talking too much. He is taking Benzonatate, Coricidin and Mucinex and this has been helpful. Denies any SOB. He has been monitoring his pulse ox at home and states home readings are 95-97% on room air. He has a prescription for home oxygen to use prn for pulse ox <93%. He has not used home O2 since first day of discharge. Also of note, he was advised not to use his CPAP due to concern of aerosolizing virus around sleeping partner.  Main concern today is headache. He has had daily headaches for approx 3 weeks (since COVID diagnosis). Reports headache is frontal and sharp, lasting for hours. Reports headache is worse with cough. Associated with nausea w/o vomiting.  He has been taking Tylenol 1000 mg every 6 hours with complete relief of headache. He does not monitor his blood glucose at home.  States since hospital discharge he has had light brown, watery diarrhea. States he self-treated with Imodium. Last episode of diarrhea was 2  nights ago. Reports he had a normal BM last night. Denies any hematochezia or melena. No associated abdominal pain. However, he is experiencing rectal itching.    Past Medical History:  Diagnosis Date  . Allergy   . BPPV (benign paroxysmal positional vertigo)   . CAD (coronary artery disease)   . Combined nonsenile cataract 01/09/2017  . Dermatochalasis 01/09/2017   Observation per Dr. Larose Kells  . Diabetes mellitus without complication ([place])   . GERD (gastroesophageal reflux disease)   . Hyperlipidemia   . Hypertension   . Myocardial infarction ([place])   . Posterior vitreous detachment of left eye 01/09/2017  . Transaminitis 06/30/2018   Past Surgical History:  Procedure Laterality Date  . CORONARY/GRAFT ACUTE MI REVASCULARIZATION  2012   Social History   Tobacco Use  . Smoking status: Never Smoker  . Smokeless tobacco: Never Used  Substance Use Topics  . Alcohol use: No   family history includes Diabetes in his mother; Heart disease in his mother; Kidney disease in his mother.    ROS: negative except as noted in the HPI  Medications: Current Outpatient Medications  Medication Sig Dispense Refill  . atorvastatin (LIPITOR) 80 MG tablet Take 80 mg by mouth daily.    . isosorbide mononitrate (IMDUR) 30  MG 24 hr tablet Take 30 mg by mouth.    . metFORMIN (GLUCOPHAGE-XR) 500 MG 24 hr tablet Take 2 tablets (1,000 mg total) by mouth 2 (two) times daily with a meal. 360 tablet 0  . metoprolol tartrate (LOPRESSOR) 25 MG tablet Take 25 mg by mouth 2 (two) times daily.    . nitroGLYCERIN (NITROSTAT) 0.4 MG SL tablet Dissolve 1 tab under tongue at first sign of angina attack. May repeat Q39mn if needed for a total of 3 tabs in 15 mins; if no relief, seek medical attention promptly 6 tablet 3  . AMBULATORY NON FORMULARY MEDICATION Single glucometer with lancets, test strips 1 each 0  . aspirin 81 MG tablet Take 81 mg by mouth daily.    . blood glucose meter kit and supplies KIT Check  morning fasting glucose daily and up to four times daily as needed 1 each 0  . hydrocortisone (ANUSOL-HC) 25 MG suppository Place 1 suppository (25 mg total) rectally 2 (two) times daily as needed for up to 14 days for hemorrhoids or anal itching. (Patient not taking: Reported on 12/04/2018) 24 suppository 1  . losartan (COZAAR) 25 MG tablet Take 1 tablet (25 mg total) by mouth daily. 30 tablet 0   No current facility-administered medications for this visit.    No Known Allergies     Objective:  BP 135/89   Pulse 79   SpO2 97%  Gen:  alert, not ill-appearing, no distress, appropriate for age H31 head normocephalic without obvious abnormality, conjunctiva and cornea clear, trachea midline Pulm: Normal work of breathing, normal phonation, frequent paroxysmal nonproductive cough  Assessment and Plan: 58y.o. male with   .JMaximillianwas seen today for hospitalization follow-up, headache and cough.  Diagnoses and all orders for this visit:  Hospital discharge follow-up  Pruritus ani -     Discontinue: witch hazel-glycerin (TUCKS) pad; Apply 1 application topically as needed. -     hydrocortisone (ANUSOL-HC) 25 MG suppository; Place 1 suppository (25 mg total) rectally 2 (two) times daily as needed for up to 14 days for hemorrhoids or anal itching. (Patient not taking: Reported on 12/04/2018)  Cough headache syndrome  Respiratory tract infection due to COVID-19 virus   Vital signs reviewed Patient is not requiring home oxygen and sats are >95% Symptoms are gradually improving, but he is still having post-viral cough, headache and fatigue. Diarrhea has resolved. Continue to treat supportively  Follow-up in office in 1 week   Follow Up Instructions:    I discussed the assessment and treatment plan with the patient. The patient was provided an opportunity to ask questions and all were answered. The patient agreed with the plan and demonstrated an understanding of the instructions.    The patient was advised to call back or seek an in-person evaluation if the symptoms worsen or if the condition fails to improve as anticipated.  I provided 11-20 minutes of non-face-to-face time during this encounter.   CTrixie Dredge [place]

## 2018-12-04 ENCOUNTER — Encounter: Payer: Self-pay | Admitting: Physician Assistant

## 2018-12-04 ENCOUNTER — Ambulatory Visit (INDEPENDENT_AMBULATORY_CARE_PROVIDER_SITE_OTHER): Payer: BC Managed Care – PPO | Admitting: Physician Assistant

## 2018-12-04 ENCOUNTER — Other Ambulatory Visit: Payer: Self-pay

## 2018-12-04 ENCOUNTER — Ambulatory Visit (INDEPENDENT_AMBULATORY_CARE_PROVIDER_SITE_OTHER): Payer: BC Managed Care – PPO

## 2018-12-04 VITALS — BP 129/73 | HR 81 | Temp 98.5°F | Wt 172.0 lb

## 2018-12-04 DIAGNOSIS — G4452 New daily persistent headache (NDPH): Secondary | ICD-10-CM

## 2018-12-04 DIAGNOSIS — E11620 Type 2 diabetes mellitus with diabetic dermatitis: Secondary | ICD-10-CM

## 2018-12-04 DIAGNOSIS — E1165 Type 2 diabetes mellitus with hyperglycemia: Secondary | ICD-10-CM | POA: Diagnosis not present

## 2018-12-04 DIAGNOSIS — Z8619 Personal history of other infectious and parasitic diseases: Secondary | ICD-10-CM

## 2018-12-04 DIAGNOSIS — Z8616 Personal history of COVID-19: Secondary | ICD-10-CM

## 2018-12-04 DIAGNOSIS — G4483 Primary cough headache: Secondary | ICD-10-CM

## 2018-12-04 DIAGNOSIS — I10 Essential (primary) hypertension: Secondary | ICD-10-CM

## 2018-12-04 DIAGNOSIS — B353 Tinea pedis: Secondary | ICD-10-CM

## 2018-12-04 DIAGNOSIS — R51 Headache: Secondary | ICD-10-CM | POA: Diagnosis not present

## 2018-12-04 DIAGNOSIS — I152 Hypertension secondary to endocrine disorders: Secondary | ICD-10-CM

## 2018-12-04 DIAGNOSIS — E1159 Type 2 diabetes mellitus with other circulatory complications: Secondary | ICD-10-CM

## 2018-12-04 LAB — POCT GLYCOSYLATED HEMOGLOBIN (HGB A1C): HbA1c, POC (controlled diabetic range): 6.8 % (ref 0.0–7.0)

## 2018-12-04 MED ORDER — LOSARTAN POTASSIUM 25 MG PO TABS: 25.0000 mg | ORAL_TABLET | Freq: Every day | ORAL | 0 refills | Status: DC

## 2018-12-04 MED ORDER — BLOOD GLUCOSE MONITOR KIT: PACK | 0 refills | Status: AC

## 2018-12-04 NOTE — Patient Instructions (Signed)
Diabetes Preventive Care: - annual foot exam, self foot exams at least weekly - annual dilated eye exam with an eye doctor - pneumonia vaccine once (booster in 5 years and at age 58) - annual influenza vaccine - twice yearly dental cleanings and yearly exam - blood pressure goal <130/80 - LDL cholesterol goal <70 - A1C goal <7.0 - body mass index (BMI) less than or equal to 29.0 (ideally below 25) - follow-up every 3 months if your A1C is not at goal - follow-up every 6 months if diabetes is well controlled   General Headache Without Cause A headache is pain or discomfort felt around the head or neck area. The specific cause of a headache may not be found. There are many causes and types of headaches. A few common ones are:  Tension headaches.  Migraine headaches.  Cluster headaches.  Chronic daily headaches. Follow these instructions at home: Watch your condition for any changes. Let your health care provider know about them. Take these steps to help with your condition: Managing pain      Take over-the-counter and prescription medicines only as told by your health care provider.  Lie down in a dark, quiet room when you have a headache.  If directed, put ice on your head and neck area: ? Put ice in a plastic bag. ? Place a towel between your skin and the bag. ? Leave the ice on for 20 minutes, 2-3 times per day.  If directed, apply heat to the affected area. Use the heat source that your health care provider recommends, such as a moist heat pack or a heating pad. ? Place a towel between your skin and the heat source. ? Leave the heat on for 20-30 minutes. ? Remove the heat if your skin turns bright red. This is especially important if you are unable to feel pain, heat, or cold. You may have a greater risk of getting burned.  Keep lights dim if bright lights bother you or make your headaches worse. Eating and drinking  Eat meals on a regular schedule.  If you drink  alcohol: ? Limit how much you use to:  0-1 drink a day for women.  0-2 drinks a day for men. ? Be aware of how much alcohol is in your drink. In the [place] one drink equals one 12 oz bottle of beer (355 mL), one 5 oz glass of wine (148 mL), or one 1 oz glass of hard liquor (44 mL).  Stop drinking caffeine, or decrease the amount of caffeine you drink. General instructions   Keep a headache journal to help find out what may trigger your headaches. For example, write down: ? What you eat and drink. ? How much sleep you get. ? Any change to your diet or medicines.  Try massage or other relaxation techniques.  Limit stress.  Sit up straight, and do not tense your muscles.  Do not use any products that contain nicotine or tobacco, such as cigarettes, e-cigarettes, and chewing tobacco. If you need help quitting, ask your health care provider.  Exercise regularly as told by your health care provider.  Sleep on a regular schedule. Get 7-9 hours of sleep each night, or the amount recommended by your health care provider.  Keep all follow-up visits as told by your health care provider. This is important. Contact a health care provider if:  Your symptoms are not helped by medicine.  You have a headache that is different from the usual headache.  You have nausea or you vomit.  You have a fever. Get help right away if:  Your headache becomes severe quickly.  Your headache gets worse after moderate to intense physical activity.  You have repeated vomiting.  You have a stiff neck.  You have a loss of vision.  You have problems with speech.  You have pain in the eye or ear.  You have muscular weakness or loss of muscle control.  You lose your balance or have trouble walking.  You feel faint or pass out.  You have confusion.  You have a seizure. Summary  A headache is pain or discomfort felt around the head or neck area.  There are many causes and types of  headaches. In some cases, the cause may not be found.  Keep a headache journal to help find out what may trigger your headaches. Watch your condition for any changes. Let your health care provider know about them.  Contact a health care provider if you have a headache that is different from the usual headache, or if your symptoms are not helped by medicine.  Get help right away if your headache becomes severe, you vomit, you have a loss of vision, you lose your balance, or you have a seizure. This information is not intended to replace advice given to you by your health care provider. Make sure you discuss any questions you have with your health care provider. Document Released: 04/15/2005 Document Revised: 11/03/2017 Document Reviewed: 11/03/2017 Elsevier Patient Education  2020 [employer].

## 2018-12-04 NOTE — Progress Notes (Signed)
HPI:                                                                Zachary Harvey is a 58 y.o. male who presents to [facility] today for hospital follow-up  COVID-19 Positive on 7/7. First symptom on 7/3. He was admitted 7/15-7/18 at [facility].  Continues to endorse a non-productive cough, but states it is getting better. States cough is namely triggered by talking too much. He is taking Coricidin and Mucinex and this has been helpful. Denies any SOB. He has been monitoring his pulse ox at home and states home readings are 95-97% on room air. He has a prescription for home oxygen to use prn for pulse ox <93%. He has not used home O2 since first day of discharge and has contacted the company to return.  Patient has had new daily persistent headache since his hospitalization. Reports headaches are frontal and sharp, lasting for hours. Reports headache is worse with cough. Associated with nausea w/o vomiting, dizziness, and blurred vision. No gait disturbance, focal weakness, paresthesias, double vision.  He has been taking Tylenol 1000 mg every 6 hours with moderate relief, but headache recurs. Of note, he has been taking Tylenol and Coricidin daily for over 1 month since onset of Coronavirus. Headache today is rated as 5/10.  He also reports dry/itchy soles of his feet with an intermittent burning sensation since 7/26. He noted that skin on his feet was extremely dry and flaking off. He has been using an OTC foot cream.    Past Medical History:  Diagnosis Date  . Allergy   . BPPV (benign paroxysmal positional vertigo)   . CAD (coronary artery disease)   . Combined nonsenile cataract 01/09/2017  . Dermatochalasis 01/09/2017   Observation per Dr. Larose Kells  . Diabetes mellitus without complication ([place])   . GERD (gastroesophageal reflux disease)   . Hyperlipidemia   . Hypertension   . Myocardial infarction ([place])   . Posterior vitreous  detachment of left eye 01/09/2017  . Transaminitis 06/30/2018   Past Surgical History:  Procedure Laterality Date  . CORONARY/GRAFT ACUTE MI REVASCULARIZATION  2012   Social History   Tobacco Use  . Smoking status: Never Smoker  . Smokeless tobacco: Never Used  Substance Use Topics  . Alcohol use: No   family history includes Diabetes in his mother; Heart disease in his mother; Kidney disease in his mother.    ROS: negative except as noted in the HPI  Medications: Current Outpatient Medications  Medication Sig Dispense Refill  . atorvastatin (LIPITOR) 80 MG tablet Take 80 mg by mouth daily.    . isosorbide mononitrate (IMDUR) 30 MG 24 hr tablet Take 30 mg by mouth.    . metFORMIN (GLUCOPHAGE-XR) 500 MG 24 hr tablet Take 2 tablets (1,000 mg total) by mouth 2 (two) times daily with a meal. 360 tablet 0  . metoprolol tartrate (LOPRESSOR) 25 MG tablet Take 25 mg by mouth 2 (two) times daily.    . nitroGLYCERIN (NITROSTAT) 0.4 MG SL tablet Dissolve 1 tab under tongue at first sign of angina attack. May repeat Q66mn if needed for a total of 3 tabs in 15 mins; if no relief, seek medical attention promptly  6 tablet 3  . AMBULATORY NON FORMULARY MEDICATION Single glucometer with lancets, test strips 1 each 0  . aspirin 81 MG tablet Take 81 mg by mouth daily.    . blood glucose meter kit and supplies KIT Check morning fasting glucose daily and up to four times daily as needed 1 each 0  . hydrocortisone (ANUSOL-HC) 25 MG suppository Place 1 suppository (25 mg total) rectally 2 (two) times daily as needed for up to 14 days for hemorrhoids or anal itching. (Patient not taking: Reported on 12/04/2018) 24 suppository 1   No current facility-administered medications for this visit.    No Known Allergies     Objective:  BP (!) 147/81   Pulse 81   Temp 98.5 F (36.9 C) (Oral)   Wt 172 lb (78 kg)   BMI 26.94 kg/m   Vitals:   12/04/18 1319 12/04/18 1353  BP: (!) 147/81 129/73  Pulse: 81    Temp: 98.5 F (36.9 C)    Wt Readings from Last 3 Encounters:  12/04/18 172 lb (78 kg)  06/29/18 178 lb (80.7 kg)  10/03/17 182 lb (82.6 kg)   Temp Readings from Last 3 Encounters:  12/04/18 98.5 F (36.9 C) (Oral)  11/09/18 (!) 96.6 F (35.9 C)  11/02/18 98 F (36.7 C) (Oral)   BP Readings from Last 3 Encounters:  12/04/18 129/73  11/23/18 135/89  06/29/18 127/79   Pulse Readings from Last 3 Encounters:  12/04/18 81  11/23/18 79  06/29/18 73    Gen: well-groomed, not ill-appearing, no acute distress HEENT: head normocephalic, atraumatic; conjunctiva and cornea clear, oropharynx clear, moist mucus membranes; neck supple, no meningeal signs Pulm: Normal work of breathing, normal phonation, clear to auscultation bilaterally CV: Normal rate, regular rhythm, s1 and s2 distinct, no murmurs, clicks or rubs Neuro:  cranial nerves II-XII intact, no nystagmus, normal finger-to-nose, normal heel-to-shin, negative pronator drift, normal rapid alternating movements, normal tone, no tremor MSK: strength 5/5 and symmetric in bilateral upper and lower extremities, normal gait and station, mildly positive Romberg Mental Status: alert and oriented x 3, speech articulate, and thought processes clear and goal-directed  Diabetic Foot Exam - Simple   Simple Foot Form Diabetic Foot exam was performed with the following findings: Yes 12/04/2018  1:38 PM  Visual Inspection See comments: Yes Sensation Testing Intact to touch and monofilament testing bilaterally: Yes Pulse Check Posterior Tibialis and Dorsalis pulse intact bilaterally: Yes Comments Dry scaly skin of bilateral feet without ulceration      Results for orders placed or performed in visit on 12/04/18 (from the past 72 hour(s))  POCT HgB A1C     Status: None   Collection Time: 12/04/18  1:24 PM  Result Value Ref Range   Hemoglobin A1C     HbA1c POC (<> result, manual entry)     HbA1c, POC (prediabetic range)     HbA1c, POC  (controlled diabetic range) 6.8 0.0 - 7.0 %   Recent Results (from the past 2160 hour(s))  Novel Coronavirus, NAA ([facility])     Status: Abnormal   Collection Time: 11/03/18  9:08 AM  Result Value Ref Range   SARS-CoV-2, NAA Detected (A) Not Detected    Comment: Testing was performed using the cobas(R) SARS-CoV-2 test. This test was developed and its performance characteristics determined by [employer]. This test has not been FDA cleared or approved. This test has been authorized by FDA under an Emergency Use Authorization (EUA). This test is only authorized  for the duration of time the declaration that circumstances exist justifying the authorization of the emergency use of in vitro diagnostic tests for detection of SARS-CoV-2 virus and/or diagnosis of COVID-19 infection under section 564(b)(1) of the Act, 21 U.S.C. 161WRU-0(A)(5), unless the authorization is terminated or revoked sooner. When diagnostic testing is negative, the possibility of a false negative result should be considered in the context of a patient's recent exposures and the presence of clinical signs and symptoms consistent with COVID-19. An individual without symptoms of COVID-19 and who is not shedding SARS-CoV-2 virus would expect to have a negati ve (not detected) result in this assay.   POCT HgB A1C     Status: None   Collection Time: 12/04/18  1:24 PM  Result Value Ref Range   Hemoglobin A1C     HbA1c POC (<> result, manual entry)     HbA1c, POC (prediabetic range)     HbA1c, POC (controlled diabetic range) 6.8 0.0 - 7.0 %      Assessment and Plan: 58 y.o. male with  .[place] was seen today for hyperglycemia.  Diagnoses and all orders for this visit:  New daily persistent headache -     CT Head Wo Contrast  Uncontrolled type 2 diabetes mellitus with hyperglycemia ([place])  History of 2019 novel coronavirus disease (COVID-19)  Controlled type 2 diabetes mellitus with diabetic dermatitis,  without long-term current use of insulin (HCC) -     POCT HgB A1C -     losartan (COZAAR) 25 MG tablet; Take 1 tablet (25 mg total) by mouth daily. -     blood glucose meter kit and supplies KIT; Check morning fasting glucose daily and up to four times daily as needed  Primary cough headache -     CT Head Wo Contrast  Hypertension associated with diabetes (HCC) -     losartan (COZAAR) 25 MG tablet; Take 1 tablet (25 mg total) by mouth daily.  Tinea pedis of both feet   New daily persistent headache Reassuring neuro exam Still recovering from recent COVID-19 infection I suspect this is medication overuse headache. Hypertension may be contributory CTH pending given patient's risk factors  DM2 Lab Results  Component Value Date   HGBA1C 6.8 12/04/2018  well controlled A1C goal <7.0, cont current meds BP goal <130/80 LDL goal <70, cont statin Urine microalbumin: starting ARB Diabetic foot exam: performed today Diabetic eye exam: UTD  HTN BP goal <130/80 BP out of range in office, improved on re-check Start low dose Losartan 25 mg QD Cont Lopressor 25 mg bid Cont baby asa for secondary prevention Check renal function in 2 weeks  Tinea pedis Instructed to use OTC antifungal athletes foot spray bid x 2-3 weeks until resolution of symptoms  Patient education and anticipatory guidance given Patient agrees with treatment plan Follow-up in 2 weeks or sooner as needed if symptoms worsen or fail to improve  I spent 40 minutes with this patient, greater than 50% was face-to-face time counseling regarding the above diagnoses  Darlyne Russian PA-C

## 2018-12-05 ENCOUNTER — Encounter: Payer: Self-pay | Admitting: Physician Assistant

## 2018-12-11 DIAGNOSIS — I10 Essential (primary) hypertension: Secondary | ICD-10-CM | POA: Diagnosis not present

## 2018-12-11 DIAGNOSIS — R079 Chest pain, unspecified: Secondary | ICD-10-CM | POA: Diagnosis not present

## 2018-12-13 ENCOUNTER — Encounter: Payer: Self-pay | Admitting: Physician Assistant

## 2018-12-18 ENCOUNTER — Encounter: Payer: Self-pay | Admitting: Physician Assistant

## 2018-12-18 ENCOUNTER — Other Ambulatory Visit: Payer: Self-pay

## 2018-12-18 ENCOUNTER — Ambulatory Visit (INDEPENDENT_AMBULATORY_CARE_PROVIDER_SITE_OTHER): Payer: BC Managed Care – PPO | Admitting: Physician Assistant

## 2018-12-18 VITALS — BP 100/62 | HR 66 | Temp 98.5°F | Wt 173.0 lb

## 2018-12-18 DIAGNOSIS — E1159 Type 2 diabetes mellitus with other circulatory complications: Secondary | ICD-10-CM

## 2018-12-18 DIAGNOSIS — G43019 Migraine without aura, intractable, without status migrainosus: Secondary | ICD-10-CM | POA: Diagnosis not present

## 2018-12-18 DIAGNOSIS — I10 Essential (primary) hypertension: Secondary | ICD-10-CM

## 2018-12-18 DIAGNOSIS — I152 Hypertension secondary to endocrine disorders: Secondary | ICD-10-CM

## 2018-12-18 MED ORDER — BACLOFEN 10 MG PO TABS: 10.0000 mg | ORAL_TABLET | Freq: Three times a day (TID) | ORAL | 0 refills | Status: DC | PRN

## 2018-12-18 NOTE — Progress Notes (Signed)
HPI:                                                                Zachary Harvey is a 58 y.o. male who presents to [facility]: [facility] today for hospital follow-up  COVID-19 Positive on 7/7. First symptom on 7/3. He was admitted 7/15-7/18 at [facility]   Patient has had new daily persistent headache since his hospitalization. Reports headaches are frontal and sharp, lasting for hours. Reports headache is worse with cough. Associated with nausea w/o vomiting, dizziness, and blurred vision. No gait disturbance, focal weakness, paresthesias, double vision.  He has been taking Tylenol 1000 mg every 6 hours with moderate relief, but headache recurs. Of note, he has been taking Tylenol and Coricidin daily for over 1 month since onset of Coronavirus.  At last visit on 12/04/18 he underwent CTH, which was negative. He was counseled that he may have medication overuse headache and instructed to stop OTC pain relievers and keep headache diary.  Today patient tells me that he actually has a history of migraine headaches and his symptoms feel similar to prior migraines.   Headaches every day since 12/05/18 have been rated 2-4 in severity most days with two severe headaches where he had to take Tylenol  He has also been monitoring his BP at home. He self-discontinued Losartan after about 1 week because he thought it was supposed to help his headaches. His Cardiologist increased his Metoprolol to 50 mg bid SBP range 121-141 DBP range 69-93 Pulse 67-86   Past Medical History:  Diagnosis Date  . Allergy   . BPPV (benign paroxysmal positional vertigo)   . CAD (coronary artery disease)   . Combined nonsenile cataract 01/09/2017  . Dermatochalasis 01/09/2017   Observation per Dr. Larose Kells  . Diabetes mellitus without complication ([place])   . GERD (gastroesophageal reflux disease)   . Hyperlipidemia   . Hypertension   . Myocardial infarction ([place])   . Posterior  vitreous detachment of left eye 01/09/2017  . Transaminitis 06/30/2018   Past Surgical History:  Procedure Laterality Date  . CORONARY/GRAFT ACUTE MI REVASCULARIZATION  2012   Social History   Tobacco Use  . Smoking status: Never Smoker  . Smokeless tobacco: Never Used  Substance Use Topics  . Alcohol use: No   family history includes Diabetes in his mother; Heart disease in his mother; Kidney disease in his mother.    ROS: negative except as noted in the HPI  Medications: Current Outpatient Medications  Medication Sig Dispense Refill  . AMBULATORY NON FORMULARY MEDICATION Single glucometer with lancets, test strips 1 each 0  . aspirin 81 MG tablet Take 81 mg by mouth daily.    Marland Kitchen aspirin-acetaminophen-caffeine (EXCEDRIN MIGRAINE) 250-250-65 MG tablet Take by mouth every 6 (six) hours as needed.    Marland Kitchen atorvastatin (LIPITOR) 80 MG tablet Take 80 mg by mouth daily.    . blood glucose meter kit and supplies KIT Check morning fasting glucose daily and up to four times daily as needed 1 each 0  . isosorbide mononitrate (IMDUR) 30 MG 24 hr tablet Take 30 mg by mouth.    . metFORMIN (GLUCOPHAGE-XR) 500 MG 24 hr tablet Take 2 tablets (1,000 mg total) by mouth 2 (  two) times daily with a meal. 360 tablet 0  . nitroGLYCERIN (NITROSTAT) 0.4 MG SL tablet Dissolve 1 tab under tongue at first sign of angina attack. May repeat Q17mn if needed for a total of 3 tabs in 15 mins; if no relief, seek medical attention promptly 6 tablet 3  . baclofen (LIORESAL) 10 MG tablet Take 1 tablet (10 mg total) by mouth 3 (three) times daily as needed (headache/migraine). 30 each 0  . metoprolol tartrate (LOPRESSOR) 50 MG tablet Take 50 mg by mouth 2 (two) times daily.     No current facility-administered medications for this visit.    No Known Allergies     Objective:  BP 100/62   Pulse 66   Temp 98.5 F (36.9 C) (Oral)   Wt 173 lb (78.5 kg)   BMI 27.10 kg/m    Wt Readings from Last 3 Encounters:   12/18/18 173 lb (78.5 kg)  12/04/18 172 lb (78 kg)  06/29/18 178 lb (80.7 kg)   Temp Readings from Last 3 Encounters:  12/18/18 98.5 F (36.9 C) (Oral)  12/04/18 98.5 F (36.9 C) (Oral)  11/09/18 (!) 96.6 F (35.9 C)   BP Readings from Last 3 Encounters:  12/18/18 100/62  12/04/18 129/73  11/23/18 135/89   Pulse Readings from Last 3 Encounters:  12/18/18 66  12/04/18 81  11/23/18 79    Gen: well-groomed, not ill-appearing, no acute distress HEENT: head normocephalic, atraumatic; conjunctiva and cornea clear, oropharynx clear, moist mucus membranes; neck supple, no meningeal signs Pulm: Normal work of breathing, normal phonation Neuro: alert and oriented x 3 Psych: cooperative, euthymic mood, affect mood-congruent, speech is articulate, normal rate and volume; thought processes clear and goal-directed, normal judgment, good insight    No results found for this or any previous visit (from the past 72 hour(s)). Recent Results (from the past 2160 hour(s))  Novel Coronavirus, NAA ([facility])     Status: Abnormal   Collection Time: 11/03/18  9:08 AM  Result Value Ref Range   SARS-CoV-2, NAA Detected (A) Not Detected    Comment: Testing was performed using the cobas(R) SARS-CoV-2 test. This test was developed and its performance characteristics determined by [employer] This test has not been FDA cleared or approved. This test has been authorized by FDA under an Emergency Use Authorization (EUA). This test is only authorized for the duration of time the declaration that circumstances exist justifying the authorization of the emergency use of in vitro diagnostic tests for detection of SARS-CoV-2 virus and/or diagnosis of COVID-19 infection under section 564(b)(1) of the Act, 21 U.S.C. 3284XLK-4(M)(0, unless the authorization is terminated or revoked sooner. When diagnostic testing is negative, the possibility of a false negative result should be considered in the  context of a patient's recent exposures and the presence of clinical signs and symptoms consistent with COVID-19. An individual without symptoms of COVID-19 and who is not shedding SARS-CoV-2 virus would expect to have a negati ve (not detected) result in this assay.   POCT HgB A1C     Status: None   Collection Time: 12/04/18  1:24 PM  Result Value Ref Range   Hemoglobin A1C     HbA1c POC (<> result, manual entry)     HbA1c, POC (prediabetic range)     HbA1c, POC (controlled diabetic range) 6.8 0.0 - 7.0 %    Study Result  CLINICAL DATA:  Headache with blurred vision and dizziness since positive COVID-19 test November 03, 2018. Continued cough.  EXAM:  CT HEAD WITHOUT CONTRAST  TECHNIQUE: Contiguous axial images were obtained from the base of the skull through the vertex without intravenous contrast.  COMPARISON:  None.  FINDINGS: Brain: No evidence of acute infarction, hemorrhage, hydrocephalus, extra-axial collection or mass lesion/mass effect.  Vascular: No hyperdense vessel or unexpected calcification.  Skull: Normal. Negative for fracture or focal lesion.  Sinuses/Orbits: No acute finding.  Other: None.  IMPRESSION: No cause for the patient's symptoms identified. No acute abnormalities are noted.   Electronically Signed   By: Dorise Bullion III M.D   On: 12/04/2018 15:17     Assessment and Plan: 58 y.o. male with  .Diagnoses and all orders for this visit:  Intractable migraine without aura and without status migrainosus -     Ambulatory referral to Neurology -     baclofen (LIORESAL) 10 MG tablet; Take 1 tablet (10 mg total) by mouth 3 (three) times daily as needed (headache/migraine).  Hypertension associated with diabetes ([place]) -     Cancel: BASIC METABOLIC PANEL WITH GFR     Intractable headache Headaches are mild-moderate, have migrainous features. Negative CTH on 8/07 Reassuring neuro exam on 8/07 Still recovering from recent  COVID-19 infection with medication overuse headache likely contributory. He is also on long-acting nitro Due to his CAD, unable to use triptans Trial of Baclofen 10 mg. Continue to limit OTC analgesics Cont CPAP nightly Referral to neurology for further management    HTN BP goal <130/80 BP out of range in office, improved on re-check Self-discontinued Losartan 25 mg QD Cont Metoprolol 50 mg bid per Cardiology Cont baby asa for secondary prevention   Patient education and anticipatory guidance given Patient agrees with treatment plan Follow-up in 3 months for diabetes/HTN or sooner as needed if symptoms worsen or fail to improve   Darlyne Russian PA-C

## 2018-12-18 NOTE — Patient Instructions (Addendum)
Limit use of Excedrin/Tylenol to yellow-red headaches You can take with muscle relaxer Baclofen up to three times daily as needed Medication overuse headache typically occurs when you use pain relievers 14 or more days per month  Recurrent Migraine Headache  Migraines are a type of headache, and they are usually stronger and more sudden than normal headaches (tension headaches). Migraines are characterized by an intense pulsing, throbbing pain that is usually only present on one side of the head. Sometimes, migraine headaches can cause nausea, vomiting, sensitivity to light and sound, and vision changes. Recurrent migraines keep coming back (recurring). A migraine can last from 4 hours up to 3 days. What are the causes? The exact cause of this condition is not known. However, a migraine may be caused when nerves in the brain become irritated and release chemicals that cause inflammation of blood vessels. This inflammation causes pain. Certain things may also trigger migraines, such as:  A disruption in your regular eating and sleeping schedule.  Smoking.  Stress.  Menstruation.  Certain foods and drinks, such as: ? Aged cheese. ? Chocolate. ? Alcohol. ? Caffeine. ? Foods or drinks that contain nitrates, glutamate, aspartame, MSG, or tyramine.  Lack of sleep.  Hunger.  Physical exertion.  Fatigue.  High altitude.  Weather changes.  Medicines, such as: ? Nitroglycerin, which is used to treat chest pain. ? Birth control pills. ? Estrogen. ? Some blood pressure medicines. What are the signs or symptoms? Symptoms of this condition vary for each person and may include:  Pain that is usually only present on one side of the head. In some cases, the pain may be on both sides of the head or around the head or neck.  Pulsating or throbbing pain.  Severe pain that prevents daily activities.  Pain that is aggravated by any physical activity.  Nausea, vomiting, or both.   Dizziness.  Pain with exposure to bright lights, loud noises, or activity.  General sensitivity to bright lights, loud noises, or smells. Before you get a migraine, you may get warning signs that a migraine is coming (aura). An aura may include:  Seeing flashing lights.  Seeing bright spots, halos, or zigzag lines.  Having tunnel vision or blurred vision.  Having numbness or a tingling feeling.  Having trouble talking.  Having muscle weakness.  Smelling a certain odor. How is this diagnosed? This condition is often diagnosed based on:  Your symptoms and medical history.  A physical exam. You may also have tests, including:  A CT scan or MRI of your brain. These imaging tests cannot diagnose migraines, but they can help to rule out other causes of headaches.  Blood tests. How is this treated? This condition is treated with:  Medicines. These are used for: ? Lessening pain and nausea. ? Preventing recurrent migraines.  Lifestyle changes, such as changes to your diet or sleeping patterns.  Behavior therapy, such as relaxation training or biofeedback. Biofeedback is a treatment that involves teaching you to relax and use your brain to lower your heart rate and control your breathing. Follow these instructions at home: Medicines  Take over-the-counter and prescription medicines only as told by your health care provider.  Do not drive or use heavy machinery while taking prescription pain medicine. Lifestyle  Do not use any products that contain nicotine or tobacco, such as cigarettes and e-cigarettes. If you need help quitting, ask your health care provider.  Limit alcohol intake to no more than 1 drink a day for  nonpregnant women and 2 drinks a day for men. One drink equals 12 oz of beer, 5 oz of wine, or 1 oz of hard liquor.  Get 7-9 hours of sleep each night, or the amount of sleep recommended by your health care provider.  Limit your stress. Talk with your health  care provider if you need help with stress management.  Maintain a healthy weight. If you need help losing weight, ask your health care provider.  Exercise regularly. Aim for 150 minutes of moderate-intensity exercise (walking, biking, yoga) or 75 minutes of vigorous exercise (running, circuit training, swimming) each week. General instructions   Keep a journal to find out what triggers your migraine headaches so you can avoid these triggers. For example, write down: ? What you eat and drink. ? How much sleep you get. ? Any change to your diet or medicines.  Lie down in a dark, quiet room when you have a migraine.  Try placing a cool towel over your head when you have a migraine.  Keep lights dim, if bright lights bother you and make your migraines worse.  Keep all follow-up visits as told by your health care provider. This is important. Contact a health care provider if:  Your pain does not improve, even with medicine.  Your migraines continue to return, even with medicine.  You have a fever.  You have weight loss. Get help right away if:  Your migraine becomes severe and medicine does not help.  You have a stiff neck.  You have a loss of vision.  You have muscle weakness or loss of muscle control.  You start losing your balance or have trouble walking.  You feel faint or you pass out.  You develop new, severe symptoms.  You start having abrupt severe headaches that last for a second or less, like a thunderclap. Summary  Migraine headaches are usually stronger and more sudden than normal headaches (tension headaches). Migraines are characterized by an intense pulsing, throbbing pain that is usually only present on one side of the head.  The exact cause of this condition is not known. However, a migraine may be caused when nerves in the brain become irritated and release chemicals that cause inflammation of blood vessels.  Certain things may trigger migraines, such  as changes to diet or sleeping patterns, smoking, certain foods, alcohol, stress, and certain medicines.  Sometimes, migraine headaches can cause nausea, vomiting, sensitivity to light and sound, and vision changes.  Migraines are often diagnosed based on your symptoms, medical history, and a physical exam. This information is not intended to replace advice given to you by your health care provider. Make sure you discuss any questions you have with your health care provider. Document Released: 01/08/2001 Document Revised: 04/18/2017 Document Reviewed: 01/26/2016 Elsevier Patient Education  2020 [employer]

## 2018-12-25 ENCOUNTER — Encounter: Payer: Self-pay | Admitting: Physician Assistant

## 2018-12-25 DIAGNOSIS — G43019 Migraine without aura, intractable, without status migrainosus: Secondary | ICD-10-CM | POA: Insufficient documentation

## 2018-12-29 DIAGNOSIS — I2510 Atherosclerotic heart disease of native coronary artery without angina pectoris: Secondary | ICD-10-CM | POA: Diagnosis not present

## 2018-12-29 DIAGNOSIS — I208 Other forms of angina pectoris: Secondary | ICD-10-CM | POA: Diagnosis not present

## 2019-01-29 ENCOUNTER — Ambulatory Visit (INDEPENDENT_AMBULATORY_CARE_PROVIDER_SITE_OTHER): Payer: BC Managed Care – PPO

## 2019-01-29 ENCOUNTER — Ambulatory Visit (INDEPENDENT_AMBULATORY_CARE_PROVIDER_SITE_OTHER): Payer: BC Managed Care – PPO | Admitting: Sports Medicine

## 2019-01-29 ENCOUNTER — Other Ambulatory Visit: Payer: Self-pay

## 2019-01-29 ENCOUNTER — Encounter: Payer: Self-pay | Admitting: Sports Medicine

## 2019-01-29 VITALS — BP 112/69 | HR 68 | Ht 67.0 in | Wt 175.0 lb

## 2019-01-29 DIAGNOSIS — E876 Hypokalemia: Secondary | ICD-10-CM | POA: Diagnosis not present

## 2019-01-29 DIAGNOSIS — I259 Chronic ischemic heart disease, unspecified: Secondary | ICD-10-CM | POA: Diagnosis not present

## 2019-01-29 DIAGNOSIS — Z951 Presence of aortocoronary bypass graft: Secondary | ICD-10-CM | POA: Diagnosis not present

## 2019-01-29 DIAGNOSIS — G4733 Obstructive sleep apnea (adult) (pediatric): Secondary | ICD-10-CM | POA: Diagnosis not present

## 2019-01-29 DIAGNOSIS — I209 Angina pectoris, unspecified: Secondary | ICD-10-CM | POA: Diagnosis not present

## 2019-01-29 DIAGNOSIS — K219 Gastro-esophageal reflux disease without esophagitis: Secondary | ICD-10-CM | POA: Diagnosis not present

## 2019-01-29 DIAGNOSIS — M542 Cervicalgia: Secondary | ICD-10-CM | POA: Diagnosis not present

## 2019-01-29 DIAGNOSIS — J90 Pleural effusion, not elsewhere classified: Secondary | ICD-10-CM | POA: Diagnosis not present

## 2019-01-29 DIAGNOSIS — E1165 Type 2 diabetes mellitus with hyperglycemia: Secondary | ICD-10-CM | POA: Diagnosis not present

## 2019-01-29 DIAGNOSIS — E785 Hyperlipidemia, unspecified: Secondary | ICD-10-CM | POA: Diagnosis not present

## 2019-01-29 DIAGNOSIS — E119 Type 2 diabetes mellitus without complications: Secondary | ICD-10-CM | POA: Diagnosis not present

## 2019-01-29 DIAGNOSIS — M5030 Other cervical disc degeneration, unspecified cervical region: Secondary | ICD-10-CM | POA: Diagnosis not present

## 2019-01-29 DIAGNOSIS — I25810 Atherosclerosis of coronary artery bypass graft(s) without angina pectoris: Secondary | ICD-10-CM | POA: Diagnosis not present

## 2019-01-29 DIAGNOSIS — I208 Other forms of angina pectoris: Secondary | ICD-10-CM | POA: Diagnosis not present

## 2019-01-29 DIAGNOSIS — E861 Hypovolemia: Secondary | ICD-10-CM | POA: Diagnosis not present

## 2019-01-29 DIAGNOSIS — Z20828 Contact with and (suspected) exposure to other viral communicable diseases: Secondary | ICD-10-CM | POA: Diagnosis not present

## 2019-01-29 DIAGNOSIS — I25119 Atherosclerotic heart disease of native coronary artery with unspecified angina pectoris: Secondary | ICD-10-CM | POA: Diagnosis not present

## 2019-01-29 DIAGNOSIS — R0789 Other chest pain: Secondary | ICD-10-CM | POA: Diagnosis not present

## 2019-01-29 DIAGNOSIS — M545 Low back pain, unspecified: Secondary | ICD-10-CM

## 2019-01-29 DIAGNOSIS — Z8619 Personal history of other infectious and parasitic diseases: Secondary | ICD-10-CM | POA: Diagnosis not present

## 2019-01-29 DIAGNOSIS — I9589 Other hypotension: Secondary | ICD-10-CM | POA: Diagnosis not present

## 2019-01-29 DIAGNOSIS — I498 Other specified cardiac arrhythmias: Secondary | ICD-10-CM | POA: Diagnosis not present

## 2019-01-29 DIAGNOSIS — I252 Old myocardial infarction: Secondary | ICD-10-CM | POA: Diagnosis not present

## 2019-01-29 DIAGNOSIS — I214 Non-ST elevation (NSTEMI) myocardial infarction: Secondary | ICD-10-CM | POA: Diagnosis not present

## 2019-01-29 DIAGNOSIS — I249 Acute ischemic heart disease, unspecified: Secondary | ICD-10-CM | POA: Diagnosis not present

## 2019-01-29 DIAGNOSIS — J9589 Other postprocedural complications and disorders of respiratory system, not elsewhere classified: Secondary | ICD-10-CM | POA: Diagnosis not present

## 2019-01-29 DIAGNOSIS — R079 Chest pain, unspecified: Secondary | ICD-10-CM | POA: Diagnosis not present

## 2019-01-29 DIAGNOSIS — J9811 Atelectasis: Secondary | ICD-10-CM | POA: Diagnosis not present

## 2019-01-29 DIAGNOSIS — Z01810 Encounter for preprocedural cardiovascular examination: Secondary | ICD-10-CM | POA: Diagnosis not present

## 2019-01-29 DIAGNOSIS — I959 Hypotension, unspecified: Secondary | ICD-10-CM | POA: Diagnosis not present

## 2019-01-29 DIAGNOSIS — D6489 Other specified anemias: Secondary | ICD-10-CM | POA: Diagnosis not present

## 2019-01-29 DIAGNOSIS — I25118 Atherosclerotic heart disease of native coronary artery with other forms of angina pectoris: Secondary | ICD-10-CM | POA: Diagnosis not present

## 2019-01-29 DIAGNOSIS — I10 Essential (primary) hypertension: Secondary | ICD-10-CM | POA: Diagnosis not present

## 2019-01-29 DIAGNOSIS — I2510 Atherosclerotic heart disease of native coronary artery without angina pectoris: Secondary | ICD-10-CM | POA: Diagnosis not present

## 2019-01-29 DIAGNOSIS — R519 Headache, unspecified: Secondary | ICD-10-CM | POA: Diagnosis not present

## 2019-01-29 DIAGNOSIS — I25110 Atherosclerotic heart disease of native coronary artery with unstable angina pectoris: Secondary | ICD-10-CM | POA: Diagnosis not present

## 2019-01-29 DIAGNOSIS — Z7984 Long term (current) use of oral hypoglycemic drugs: Secondary | ICD-10-CM | POA: Diagnosis not present

## 2019-01-29 DIAGNOSIS — R002 Palpitations: Secondary | ICD-10-CM | POA: Diagnosis not present

## 2019-01-29 DIAGNOSIS — D696 Thrombocytopenia, unspecified: Secondary | ICD-10-CM | POA: Diagnosis not present

## 2019-01-29 LAB — POCT URINALYSIS DIPSTICK
Bilirubin, UA: NEGATIVE
Blood, UA: NEGATIVE
Glucose, UA: NEGATIVE
Ketones, UA: NEGATIVE
Leukocytes, UA: NEGATIVE
Nitrite, UA: NEGATIVE
Protein, UA: NEGATIVE
Spec Grav, UA: 1.025 (ref 1.010–1.025)
Urobilinogen, UA: 0.2 E.U./dL
pH, UA: 5.5 (ref 5.0–8.0)

## 2019-01-29 MED ORDER — PREDNISONE 50 MG PO TABS: ORAL_TABLET | ORAL | 0 refills | Status: DC

## 2019-01-29 MED ORDER — CYCLOBENZAPRINE HCL 10 MG PO TABS: ORAL_TABLET | ORAL | 0 refills | Status: DC

## 2019-01-29 NOTE — Progress Notes (Signed)
Subjective:    CC: Upper back pain  HPI: For several weeks this pleasant 58 year old male was apparently localizes in his neck with radiation around the left scapula, occasionally down the arm, worse with turning his head to the left, moderate, persistent, no bowel or bladder dysfunction, no progressive weakness, no trauma, no constitutional symptoms.  No dysuria.  I reviewed the past medical history, family history, social history, surgical history, and allergies today and no changes were needed.  Please see the problem list section below in epic for further details.  Past Medical History: Past Medical History:  Diagnosis Date  . Allergy   . BPPV (benign paroxysmal positional vertigo)   . CAD (coronary artery disease)   . Combined nonsenile cataract 01/09/2017  . Dermatochalasis 01/09/2017   Observation per Dr. Larose Kells  . Diabetes mellitus without complication ([place])   . GERD (gastroesophageal reflux disease)   . Hyperlipidemia   . Hypertension   . Myocardial infarction ([place])   . Posterior vitreous detachment of left eye 01/09/2017  . Transaminitis 06/30/2018   Past Surgical History: Past Surgical History:  Procedure Laterality Date  . CORONARY/GRAFT ACUTE MI REVASCULARIZATION  2012   Social History: Social History   Socioeconomic History  . Marital status: Married    Spouse name: Not on file  . Number of children: Not on file  . Years of education: Not on file  . Highest education level: Not on file  Occupational History  . Not on file  Social Needs  . Financial resource strain: Not on file  . Food insecurity    Worry: Not on file    Inability: Not on file  . Transportation needs    Medical: Not on file    Non-medical: Not on file  Tobacco Use  . Smoking status: Never Smoker  . Smokeless tobacco: Never Used  Substance and Sexual Activity  . Alcohol use: No  . Drug use: No  . Sexual activity: Yes    Birth control/protection: None  Lifestyle  . Physical activity     Days per week: Not on file    Minutes per session: Not on file  . Stress: Not on file  Relationships  . Social Herbalist on phone: Not on file    Gets together: Not on file    Attends religious service: Not on file    Active member of club or organization: Not on file    Attends meetings of clubs or organizations: Not on file    Relationship status: Not on file  Other Topics Concern  . Not on file  Social History Narrative  . Not on file   Family History: Family History  Problem Relation Age of Onset  . Diabetes Mother   . Heart disease Mother   . Kidney disease Mother    Allergies: No Known Allergies Medications: See med rec.  Review of Systems: No fevers, chills, night sweats, weight loss, chest pain, or shortness of breath.   Objective:    General: Well Developed, well nourished, and in no acute distress.  Neuro: Alert and oriented x3, extra-ocular muscles intact, sensation grossly intact.  HEENT: Normocephalic, atraumatic, pupils equal round reactive to light, neck supple, no masses, no lymphadenopathy, thyroid nonpalpable.  Skin: Warm and dry, no rashes. Cardiac: Regular rate and rhythm, no murmurs rubs or gallops, no lower extremity edema.  Respiratory: Clear to auscultation bilaterally. Not using accessory muscles, speaking in full sentences. Neck: Negative spurling's Full neck range of  motion Grip strength and sensation normal in bilateral hands Strength good C4 to T1 distribution No sensory change to C4 to T1 Reflexes normal  Urinalysis is negative   Impression and Recommendations:    DDD (degenerative disc disease), cervical Treating conservatively, prednisone, Flexeril at bedtime, home rehab exercises. X-rays. Return to see me in 4 to 6 weeks.   ___________________________________________ [name]. [name], M.D., ABFM., CAQSM. Primary Care and Sports Medicine [facility]  Adjunct Professor of Family  Medicine  [employer]

## 2019-01-29 NOTE — Assessment & Plan Note (Signed)
Treating conservatively, prednisone, Flexeril at bedtime, home rehab exercises. X-rays. Return to see me in 4 to 6 weeks.

## 2019-02-16 ENCOUNTER — Other Ambulatory Visit: Payer: Self-pay

## 2019-02-16 ENCOUNTER — Ambulatory Visit (INDEPENDENT_AMBULATORY_CARE_PROVIDER_SITE_OTHER): Payer: BC Managed Care – PPO | Admitting: Physician Assistant

## 2019-02-16 ENCOUNTER — Encounter: Payer: Self-pay | Admitting: Physician Assistant

## 2019-02-16 VITALS — BP 119/66 | HR 84 | Ht 67.0 in | Wt 164.0 lb

## 2019-02-16 DIAGNOSIS — E1165 Type 2 diabetes mellitus with hyperglycemia: Secondary | ICD-10-CM

## 2019-02-16 DIAGNOSIS — R0981 Nasal congestion: Secondary | ICD-10-CM

## 2019-02-16 DIAGNOSIS — Z951 Presence of aortocoronary bypass graft: Secondary | ICD-10-CM

## 2019-02-16 DIAGNOSIS — I25110 Atherosclerotic heart disease of native coronary artery with unstable angina pectoris: Secondary | ICD-10-CM

## 2019-02-16 DIAGNOSIS — E782 Mixed hyperlipidemia: Secondary | ICD-10-CM

## 2019-02-16 DIAGNOSIS — R197 Diarrhea, unspecified: Secondary | ICD-10-CM

## 2019-02-16 DIAGNOSIS — I255 Ischemic cardiomyopathy: Secondary | ICD-10-CM

## 2019-02-16 DIAGNOSIS — Z789 Other specified health status: Secondary | ICD-10-CM

## 2019-02-16 MED ORDER — FARXIGA 10 MG PO TABS: 10.0000 mg | ORAL_TABLET | Freq: Every day | ORAL | 2 refills | Status: DC

## 2019-02-16 MED ORDER — FLUTICASONE PROPIONATE 50 MCG/ACT NA SUSP
2.0000 | Freq: Every day | NASAL | 1 refills | Status: DC
Start: 2019-02-16 — End: 2019-04-26

## 2019-02-16 MED ORDER — DIPHENOXYLATE-ATROPINE 2.5-0.025 MG PO TABS
ORAL_TABLET | ORAL | 0 refills | Status: DC
Start: 2019-02-16 — End: 2019-04-26

## 2019-02-16 NOTE — Patient Instructions (Addendum)
Stop metformin. Stop immodium.   Use lomotil for 2 days.  Then start faraxiga in next 2 days. Sooner if diarrhea stops or reaches sugar over 200.  Stop allegra-try zyrtec 10mg  at bedtime.

## 2019-02-16 NOTE — Progress Notes (Signed)
Subjective:    Patient ID: Zachary Harvey, male    DOB: Jan 01, 1961, 58 y.o.   MRN: [id number]  HPI  Pt is a 58 yo male with pmhx of HLD, CAD, cardiomyopathy, T2DM, OSA on CPAP, hx of STEMI(PTCA to 100percent PDA) in 2012 who had CABG on pump done 02/05/19.   HEMODYNAMICS AND CATH: Ejection fraction: 50%  Mid LAD: 70 stenosis Distal LAD: 80 stenosis Diagonal 1: 80 stenosis Obtuse Marginal 2: 70 stenosis RCA: 90 stenosis  PROCEDURE: * 3 Vessel CABG - saphenous vein graft to the second obtuse marginal from the ascending aorta - saphenous vein graft to the first diagonal from the ascending aorta - left internal mammary artery to the left anterior descending from left subclavian artery * Endovascular left greater saphenous vein harvest  He comes into the clinic with questions and concerns.   He has had diarrhea for the last 4 days. He is taking immodium which has slowed diarrhea down some. He denies any abdominal pain. He denies any melena or hematochezia. Stools are reddish brown in color. He had to go 3 times just before this appt. He denies any fever, chills, body aches. He is a little nauseated. He does admit he just started his metformin back. He remembers similar symptoms when he first started metformin.   His sugars have been fasting in am 130s-150s.   He has questions about CPAP. He was told to use but when he does he feels like his chest is "bursting out". He wonders what to do.   He has some sinus/nasal congestion that is ongoing and associated with cpap that he usually takes coricidin but he was told to take allegra. allergra is not working and he is very congested.   He is also having some numbness and tingling and sharp pains coming from left leg where vein was harvested. No discharge or swelling.   .. Active Ambulatory Problems    Diagnosis Date Noted  . History of MI (myocardial infarction) 05/26/2015  . Hyperlipidemia 05/26/2015  . Elevated liver enzymes  05/26/2015  . BPV (benign positional vertigo) 06/12/2015  . SNHL (sensory-neural hearing loss), asymmetrical 06/26/2015  . GERD (gastroesophageal reflux disease) 07/10/2015  . Hyperglycemia 11/20/2015  . Ischemic cardiomyopathy 11/20/2015  . Hematuria 11/27/2015  . Overweight (BMI 25.0-29.9) 05/06/2016  . Allergic rhinitis 08/17/2012  . Exertional angina (HCC) 11/01/2015  . Vitamin D insufficiency 05/07/2016  . Hypertriglyceridemia 05/07/2016  . 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 Phimosis 09/20/2016  . Combined nonsenile cataract 01/09/2017  . Dermatochalasis 01/09/2017  . Posterior vitreous detachment of left eye 01/09/2017  . Colon cancer screening 10/03/2017  . Hyperlipidemia associated with type 2 diabetes mellitus (HCC) 10/03/2017  . Uncontrolled type 2 diabetes mellitus with hyperglycemia (HCC) 10/03/2017  . Diabetic polyneuropathy associated with type 2 diabetes mellitus (HCC) 06/29/2018  . Transaminitis 06/30/2018  . Pruritus ani 11/23/2018  . Cough headache syndrome 11/23/2018  . Respiratory tract infection due to COVID-19 virus 11/23/2018  . History of 2019 novel coronavirus disease (COVID-19) 12/04/2018  . Tinea pedis of both feet 12/04/2018  . Hypertension associated with diabetes (HCC) 12/04/2018  . New daily persistent headache 12/04/2018  . Controlled type 2 diabetes mellitus with diabetic dermatitis, without long-term current use of insulin (HCC) 12/04/2018  . Intractable migraine without aura and without status migrainosus 12/25/2018  . DDD (degenerative disc disease), cervical 01/29/2019  . S/P CABG (coronary artery bypass graft) 02/17/2019   Resolved Ambulatory Problems    Diagnosis Date  Noted  . No Resolved Ambulatory Problems   Past Medical History:  Diagnosis Date  . Allergy   . BPPV (benign paroxysmal positional vertigo)   . CAD (coronary artery disease)   . Diabetes mellitus without complication ([place])   . Hypertension   . Myocardial  infarction [facility])        Review of Systems See HPI>     Objective:   Physical Exam Vitals signs reviewed.  Constitutional:      Comments: Weak appearance.   HENT:     Head: Normocephalic.     Nose: Congestion (swollen turbinates. ) present.     Mouth/Throat:     Mouth: Mucous membranes are moist.  Eyes:     General:        Right eye: No discharge.        Left eye: No discharge.     Conjunctiva/sclera: Conjunctivae normal.  Cardiovascular:     Rate and Rhythm: Normal rate and regular rhythm.  Pulmonary:     Effort: Pulmonary effort is normal.     Breath sounds: Normal breath sounds.  Abdominal:     General: Bowel sounds are normal. There is no distension.     Palpations: Abdomen is soft.     Tenderness: There is no abdominal tenderness. There is no right CVA tenderness, left CVA tenderness or guarding.  Skin:    Comments:  sternum incision with signs of routine healing. No redness, discharge or swelling. Steri strips over incision.   Left medial leg 2 small incision with sutures still present. Routine healing. Bruise above superior incision. No significant redness or edema.   Neurological:     General: No focal deficit present.     Mental Status: He is alert and oriented to person, place, and time.  Psychiatric:        Mood and Affect: Mood normal.           Assessment & Plan:  .Amil was seen today for post-op follow-up.  Diagnoses and all orders for this visit:  S/P CABG (coronary artery bypass graft)  Diarrhea, unspecified type -     diphenoxylate-atropine (LOMOTIL) 2.5-0.025 MG tablet; One to 2 tablets by mouth 4 times a day as needed for diarrhea.  Hx of CABG  Coronary artery disease involving native heart with unstable angina pectoris, unspecified vessel or lesion type ([place])  Ischemic cardiomyopathy  Uncontrolled type 2 diabetes mellitus with hyperglycemia (HCC) -     dapagliflozin propanediol (FARXIGA) 10 MG TABS tablet; Take 10 mg by mouth  daily.  Mixed hyperlipidemia  Presence of surgical incision  Nasal congestion -     fluticasone (FLONASE) 50 MCG/ACT nasal spray; Place 2 sprays into both nostrils daily.  Sinus congestion -     fluticasone (FLONASE) 50 MCG/ACT nasal spray; Place 2 sprays into both nostrils daily.   I suspect the diarrhea is coming from restarting metformin. He was no on in hospital and he remembers similar symptoms when he started before. Stop metformin. Wait 1-2 days or if sugars above 200 and start farixga. This could also reduce patient overall CV risk. Watch sugars and carbs in diet. Lomotil was given to use for the next few days as well to replace imodium.   Since CPAP is causing discomfort stop. I will send msg to cardiologist and see if they have any suggestions.   For now do not use coricidin. Stop allegra and try zyrtec with flonase for congestion.   Reassurance that leg incision look  good and healing well. Bruising noted but to be expected. No swelling. Discussed if swelling worsens or wounds start having more discharge or much more red or tender to touch let cardiothoracic surgeon know. Numbness and tingling is to be expected. Cool compresses and elevation could help.   Keep Follow up with surgeon on the 12th and cardiologist on the 24th.  Follow up 6 with PCP in which you wish to establish since PCP left clinic.   Marland Kitchen.Spent 45 minutes with patient and greater than 50 percent of visit spent counseling patient regarding treatment plan.

## 2019-02-17 ENCOUNTER — Encounter: Payer: Self-pay | Admitting: Physician Assistant

## 2019-02-17 ENCOUNTER — Telehealth: Payer: Self-pay | Admitting: Neurology

## 2019-02-17 DIAGNOSIS — Z951 Presence of aortocoronary bypass graft: Secondary | ICD-10-CM | POA: Insufficient documentation

## 2019-02-17 NOTE — Telephone Encounter (Signed)
Spoke with Dr. Judeth Porch office. They sent a message to the nurse making her aware to reach out to patient regarding concerns. Note sent to their office.

## 2019-02-17 NOTE — Telephone Encounter (Signed)
-----   Message from Donella Stade, [place] sent at 02/17/2019  6:48 AM EDT ----- Can we reach out to Dr. Lunette Stands office of cardiothoracic surgery. Pt seen yesterday after CABG graft on 10/9 and states CPAP causes discomfort in chest. Not exactly sure if this is normal or if they have heard this before. Also he questions using coricidin. Maybe they could reach out to patient and follow up on these issues. More than welcome to send note from today.

## 2019-02-23 ENCOUNTER — Telehealth: Payer: Self-pay | Admitting: Neurology

## 2019-02-23 MED ORDER — AZELASTINE HCL 0.1 % NA SOLN: 1.0000 | Freq: Two times a day (BID) | NASAL | 5 refills | Status: DC

## 2019-02-23 NOTE — Telephone Encounter (Signed)
1. Try claritin 10mg  daily with I will send a new nasal spray astelin 1 spray each nostril twice a day) 2. Ok for flu shot can get at appt on 30th.  3. Yes needs to go to Dr. Lunette Stands if he is managing cpap.

## 2019-02-23 NOTE — Telephone Encounter (Signed)
Patient's wife left vm with multiple questions.   1- she states zyrtec not helping. He is having sneezing and runny nose. Wants recommendations on alternative.   2 - needs to schedule flu shot. Will get him set up with nurse visit.   3 - wanted form completed for CPAP supplier. Looks like Dr. Lunette Stands may have written for CPAP - should form be sent to their office?   Please advise.

## 2019-02-24 NOTE — Telephone Encounter (Signed)
Patient's wife made aware of medication changes.   She was transferred to the front to cancel appt with Dr. Darene Lamer - patient's neck pain okay, will make nurse visit for flu shot.   They don't know who manages CPAP. Will check with company.

## 2019-02-24 NOTE — Telephone Encounter (Signed)
Left message on machine for patient's wife to call back.   

## 2019-02-26 ENCOUNTER — Ambulatory Visit (INDEPENDENT_AMBULATORY_CARE_PROVIDER_SITE_OTHER): Payer: BC Managed Care – PPO | Admitting: Physician Assistant

## 2019-02-26 ENCOUNTER — Other Ambulatory Visit: Payer: Self-pay

## 2019-02-26 ENCOUNTER — Ambulatory Visit: Payer: BC Managed Care – PPO | Admitting: Sports Medicine

## 2019-02-26 VITALS — Temp 98.5°F | Ht 67.0 in | Wt 164.0 lb

## 2019-02-26 DIAGNOSIS — Z23 Encounter for immunization: Secondary | ICD-10-CM

## 2019-02-26 NOTE — Progress Notes (Signed)
Patient presents to clinic for yearly flu vaccine. Patient reports that it has been a few years since his last flu vaccine. He declines any allergy to eggs or latex, denies any fever in the last 48 hours, and denies any severe reaction in the past to the flu vaccine. I gave the patient a flu shot in left deltoid with no immediate complications. He was also given the VIS. He did not have any other questions at this time.

## 2019-03-03 ENCOUNTER — Other Ambulatory Visit: Payer: Self-pay | Admitting: Physician Assistant

## 2019-03-03 DIAGNOSIS — Z951 Presence of aortocoronary bypass graft: Secondary | ICD-10-CM | POA: Diagnosis not present

## 2019-03-03 DIAGNOSIS — Z48812 Encounter for surgical aftercare following surgery on the circulatory system: Secondary | ICD-10-CM | POA: Diagnosis not present

## 2019-03-03 NOTE — Telephone Encounter (Signed)
[facility] requesting new rx for OneTouch ultra blue in Vitro strips.

## 2019-03-12 DIAGNOSIS — Z951 Presence of aortocoronary bypass graft: Secondary | ICD-10-CM | POA: Diagnosis not present

## 2019-03-12 DIAGNOSIS — Z48812 Encounter for surgical aftercare following surgery on the circulatory system: Secondary | ICD-10-CM | POA: Diagnosis not present

## 2019-03-15 ENCOUNTER — Other Ambulatory Visit: Payer: Self-pay

## 2019-03-15 ENCOUNTER — Encounter: Payer: Self-pay | Admitting: Osteopathic Medicine

## 2019-03-15 ENCOUNTER — Ambulatory Visit (INDEPENDENT_AMBULATORY_CARE_PROVIDER_SITE_OTHER): Payer: BC Managed Care – PPO | Admitting: Osteopathic Medicine

## 2019-03-15 VITALS — BP 121/81 | HR 74 | Temp 98.3°F | Wt 172.1 lb

## 2019-03-15 DIAGNOSIS — G4733 Obstructive sleep apnea (adult) (pediatric): Secondary | ICD-10-CM | POA: Diagnosis not present

## 2019-03-15 DIAGNOSIS — R21 Rash and other nonspecific skin eruption: Secondary | ICD-10-CM | POA: Diagnosis not present

## 2019-03-15 MED ORDER — BETAMETHASONE DIPROPIONATE 0.05 % EX CREA: TOPICAL_CREAM | Freq: Two times a day (BID) | CUTANEOUS | 1 refills | Status: DC

## 2019-03-15 MED ORDER — CLINDAMYCIN HCL 300 MG PO CAPS: 300.0000 mg | ORAL_CAPSULE | Freq: Three times a day (TID) | ORAL | 0 refills | Status: DC

## 2019-03-15 NOTE — Patient Instructions (Signed)
I think the rash is possibly due to some skin irritation/allergy type reaction, possibly complicated by an infection of the hair follicles.  I would like to put you on an antibiotic and add a steroid cream to use twice daily for the next week.  Rash should hopefully start to do better within the next few days, the skin may take a couple of weeks to heal entirely.  Please let us know if it gets worse, or if it is not getting better/going away!

## 2019-03-15 NOTE — Progress Notes (Signed)
HPI: Zachary Harvey is a 58 y.o. male who  has a past medical history of Allergy, BPPV (benign paroxysmal positional vertigo), CAD (coronary artery disease), Combined nonsenile cataract (01/09/2017), Dermatochalasis (01/09/2017), Diabetes mellitus without complication ([place]), GERD (gastroesophageal reflux disease), Hyperlipidemia, Hypertension, Myocardial infarction [facility]), Posterior vitreous detachment of left eye (01/09/2017), and Transaminitis (06/30/2018).  he presents to [facility] today, 03/15/19,  for chief complaint of: itchy rash on CABG scar   . Context: Patient is post op from a CABG on 10/9.  Noticed rash three weeks ago at follow up appointment with cardiology. Reports he had a similar rash that spread diffusely over his entire body during his hospitalization after his bedding was changed. Was given benadryl for this rash which cleared prior to discharge.   . Location: Rash surrounds CABG incision on chest and spreads to upper abdomen. No rash elsewhere on body.  . Quality: Burning, itchy, and tender red rash. . Severity: Reports that rash has worsened and has difficulty wearing shirts and sleeping due to irritation.  . Duration: Three weeks . Modifying factors: Patient has been using dial soap as recommended post-op. Takes Xyzal and uses OTC cortisone cream which somewhat helps the rash.  . Assoc signs/symptoms: Intermittent sharp chest pain on exertion. Denies fever or draining from the incision site.    Past medical, surgical, social and family history reviewed and updated as necessary.   Current medication list and allergy/intolerance information reviewed:    Current Outpatient Medications  Medication Sig Dispense Refill  . AMBULATORY NON FORMULARY MEDICATION Single glucometer with lancets, test strips 1 each 0  . aspirin 81 MG tablet Take 81 mg by mouth daily.    Marland Kitchen aspirin-acetaminophen-caffeine (EXCEDRIN MIGRAINE) 250-250-65 MG tablet Take by  mouth every 6 (six) hours as needed.    Marland Kitchen atorvastatin (LIPITOR) 80 MG tablet Take 80 mg by mouth daily.    Marland Kitchen azelastine (ASTELIN) 0.1 % nasal spray Place 1 spray into both nostrils 2 (two) times daily. Use in each nostril as directed 30 mL 5  . blood glucose meter kit and supplies KIT Check morning fasting glucose daily and up to four times daily as needed 1 each 0  . dapagliflozin propanediol (FARXIGA) 10 MG TABS tablet Take 10 mg by mouth daily. 30 tablet 2  . diphenoxylate-atropine (LOMOTIL) 2.5-0.025 MG tablet One to 2 tablets by mouth 4 times a day as needed for diarrhea. 30 tablet 0  . fexofenadine (ALLEGRA) 180 MG tablet Take 180 mg by mouth daily.    . fluticasone (FLONASE) 50 MCG/ACT nasal spray Place 2 sprays into both nostrils daily. 16 g 1  . metFORMIN (GLUCOPHAGE-XR) 500 MG 24 hr tablet Take 2 tablets (1,000 mg total) by mouth 2 (two) times daily with a meal. 360 tablet 0  . metoprolol tartrate (LOPRESSOR) 50 MG tablet Take 50 mg by mouth 2 (two) times daily.    Glory Rosebush ULTRA test strip CHECK MORNING FASTING GLUCOSE DAILY AND UP TO 4 TIMES DAILY AS NEEDED 50 each 0  . oxycodone (OXY-IR) 5 MG capsule Take 5 mg by mouth every 4 (four) hours as needed.     No current facility-administered medications for this visit.     No Known Allergies    Review of Systems:  Constitutional:  No  fever, no chills, No recent illness  Cardiac: Chest pain per HPI. No  pressure, No palpitations  Respiratory:  No  shortness of breath.   Gastrointestinal: No  abdominal pain  Skin: Rash per HPI   Exam:  BP 121/81 (BP Location: Left Arm, Patient Position: Sitting, Cuff Size: Normal)   Pulse 74   Temp 98.3 F (36.8 C) (Oral)   Wt 172 lb 1.9 oz (78.1 kg)   BMI 26.96 kg/m   Constitutional: VS see above. General Appearance: alert, well-developed, well-nourished, NAD  Eyes: Normal lids and conjunctive, non-icteric sclera  Respiratory: Normal respiratory effort. no wheeze, no rhonchi,  no rales  Cardiovascular: S1/S2 normal, no murmur, no rub/gallop auscultated. RRR. No lower extremity edema.   Gastrointestinal: Nontender, no masses. Bowel sounds normal.  Musculoskeletal: Gait normal.   Neurological: Normal balance/coordination. No tremor.   Skin:  Tender scattered erythematous papules/pustule with blistering surrounding incision and extending to upper abdomen. Well healing midline mediastinal incision without drainage or surrounding erythema. - see photo   Psychiatric: Normal judgment/insight. Normal mood and affect.      No results found for this or any previous visit (from the past 72 hour(s)).  No results found.   ASSESSMENT/PLAN: The encounter diagnosis was Rash and nonspecific skin eruption.   Rash appears to be skin irritation or allergy reaction with possible folliculitis. Start clindamycin 300 mg TID for possible bacterial folliculitis (MRSA coverage) and betamethasone cream to apply to rash as needed. If rash does not begin to resolve in the next couple of weeks, will consider a consult to dermatology.  Meds ordered this encounter  Medications  . clindamycin (CLEOCIN) 300 MG capsule    Sig: Take 1 capsule (300 mg total) by mouth 3 (three) times daily.    Dispense:  21 capsule    Refill:  0  . betamethasone dipropionate 0.05 % cream    Sig: Apply topically 2 (two) times daily. To affected area(s) as needed    Dispense:  45 g    Refill:  1    No orders of the defined types were placed in this encounter.   Patient Instructions  I think the rash is possibly due to some skin irritation/allergy type reaction, possibly complicated by an infection of the hair follicles.  I would like to put you on an antibiotic and add a steroid cream to use twice daily for the next week.  Rash should hopefully start to do better within the next few days, the skin may take a couple of weeks to heal entirely.  Please let us know if it gets worse, or if it is not getting  better/going away!        Visit summary with medication list and pertinent instructions was printed for patient to review. All questions at time of visit were answered - patient instructed to contact office with any additional concerns or updates. ER/RTC precautions were reviewed with the patient.   Follow-up plan: Return for RECHECK SKIN IF NEEDED - A1C / DIABETES FOLLOWUP IN 1-2 MONTHS .  Note: Total time spent 25 minutes, greater than 50% of the visit was spent face-to-face counseling and coordinating care for the following: The encounter diagnosis was Rash and nonspecific skin eruption..  Please note: voice recognition software was used to produce this document, and typos may escape review. Please contact Dr. Sheppard Coil for any needed clarifications.

## 2019-03-18 DIAGNOSIS — Z951 Presence of aortocoronary bypass graft: Secondary | ICD-10-CM | POA: Diagnosis not present

## 2019-03-18 DIAGNOSIS — Z48812 Encounter for surgical aftercare following surgery on the circulatory system: Secondary | ICD-10-CM | POA: Diagnosis not present

## 2019-03-19 DIAGNOSIS — Z48812 Encounter for surgical aftercare following surgery on the circulatory system: Secondary | ICD-10-CM | POA: Diagnosis not present

## 2019-03-19 DIAGNOSIS — Z951 Presence of aortocoronary bypass graft: Secondary | ICD-10-CM | POA: Diagnosis not present

## 2019-03-23 DIAGNOSIS — Z951 Presence of aortocoronary bypass graft: Secondary | ICD-10-CM | POA: Diagnosis not present

## 2019-03-23 DIAGNOSIS — R21 Rash and other nonspecific skin eruption: Secondary | ICD-10-CM | POA: Diagnosis not present

## 2019-03-23 DIAGNOSIS — I214 Non-ST elevation (NSTEMI) myocardial infarction: Secondary | ICD-10-CM | POA: Diagnosis not present

## 2019-03-23 DIAGNOSIS — I2510 Atherosclerotic heart disease of native coronary artery without angina pectoris: Secondary | ICD-10-CM | POA: Diagnosis not present

## 2019-03-23 DIAGNOSIS — I259 Chronic ischemic heart disease, unspecified: Secondary | ICD-10-CM | POA: Diagnosis not present

## 2019-03-30 DIAGNOSIS — Z951 Presence of aortocoronary bypass graft: Secondary | ICD-10-CM | POA: Diagnosis not present

## 2019-03-30 DIAGNOSIS — Z48812 Encounter for surgical aftercare following surgery on the circulatory system: Secondary | ICD-10-CM | POA: Diagnosis not present

## 2019-03-30 DIAGNOSIS — I252 Old myocardial infarction: Secondary | ICD-10-CM | POA: Diagnosis not present

## 2019-04-02 DIAGNOSIS — Z951 Presence of aortocoronary bypass graft: Secondary | ICD-10-CM | POA: Diagnosis not present

## 2019-04-02 DIAGNOSIS — Z48812 Encounter for surgical aftercare following surgery on the circulatory system: Secondary | ICD-10-CM | POA: Diagnosis not present

## 2019-04-02 DIAGNOSIS — I252 Old myocardial infarction: Secondary | ICD-10-CM | POA: Diagnosis not present

## 2019-04-06 DIAGNOSIS — Z951 Presence of aortocoronary bypass graft: Secondary | ICD-10-CM | POA: Diagnosis not present

## 2019-04-06 DIAGNOSIS — Z48812 Encounter for surgical aftercare following surgery on the circulatory system: Secondary | ICD-10-CM | POA: Diagnosis not present

## 2019-04-06 DIAGNOSIS — I252 Old myocardial infarction: Secondary | ICD-10-CM | POA: Diagnosis not present

## 2019-04-09 DIAGNOSIS — I252 Old myocardial infarction: Secondary | ICD-10-CM | POA: Diagnosis not present

## 2019-04-09 DIAGNOSIS — Z951 Presence of aortocoronary bypass graft: Secondary | ICD-10-CM | POA: Diagnosis not present

## 2019-04-09 DIAGNOSIS — Z48812 Encounter for surgical aftercare following surgery on the circulatory system: Secondary | ICD-10-CM | POA: Diagnosis not present

## 2019-04-09 DIAGNOSIS — R5383 Other fatigue: Secondary | ICD-10-CM | POA: Diagnosis not present

## 2019-04-16 DIAGNOSIS — Z48812 Encounter for surgical aftercare following surgery on the circulatory system: Secondary | ICD-10-CM | POA: Diagnosis not present

## 2019-04-16 DIAGNOSIS — I252 Old myocardial infarction: Secondary | ICD-10-CM | POA: Diagnosis not present

## 2019-04-16 DIAGNOSIS — Z951 Presence of aortocoronary bypass graft: Secondary | ICD-10-CM | POA: Diagnosis not present

## 2019-04-20 DIAGNOSIS — I252 Old myocardial infarction: Secondary | ICD-10-CM | POA: Diagnosis not present

## 2019-04-20 DIAGNOSIS — Z48812 Encounter for surgical aftercare following surgery on the circulatory system: Secondary | ICD-10-CM | POA: Diagnosis not present

## 2019-04-20 DIAGNOSIS — Z951 Presence of aortocoronary bypass graft: Secondary | ICD-10-CM | POA: Diagnosis not present

## 2019-04-26 ENCOUNTER — Encounter: Payer: Self-pay | Admitting: Osteopathic Medicine

## 2019-04-26 ENCOUNTER — Ambulatory Visit (INDEPENDENT_AMBULATORY_CARE_PROVIDER_SITE_OTHER): Payer: BC Managed Care – PPO | Admitting: Osteopathic Medicine

## 2019-04-26 ENCOUNTER — Other Ambulatory Visit: Payer: Self-pay

## 2019-04-26 VITALS — BP 100/64 | HR 73 | Temp 98.2°F | Wt 178.0 lb

## 2019-04-26 DIAGNOSIS — E119 Type 2 diabetes mellitus without complications: Secondary | ICD-10-CM

## 2019-04-26 DIAGNOSIS — H8111 Benign paroxysmal vertigo, right ear: Secondary | ICD-10-CM | POA: Diagnosis not present

## 2019-04-26 DIAGNOSIS — Z0000 Encounter for general adult medical examination without abnormal findings: Secondary | ICD-10-CM | POA: Diagnosis not present

## 2019-04-26 MED ORDER — MECLIZINE HCL 25 MG PO TABS: 25.0000 mg | ORAL_TABLET | Freq: Three times a day (TID) | ORAL | 1 refills | Status: DC | PRN

## 2019-04-26 NOTE — Patient Instructions (Signed)
Benign Positional Vertigo Vertigo is the feeling that you or your surroundings are moving when they are not. Benign positional vertigo is the most common form of vertigo. This is usually a harmless condition (benign). This condition is positional. This means that symptoms are triggered by certain movements and positions. This condition can be dangerous if it occurs while you are doing something that could cause harm to you or others. This includes activities such as driving or operating machinery. What are the causes? In many cases, the cause of this condition is not known. It may be caused by a disturbance in an area of the inner ear that helps your brain to sense movement and balance. This disturbance can be caused by:  Viral infection (labyrinthitis).  Head injury.  Repetitive motion, such as jumping, dancing, or running. What increases the risk? You are more likely to develop this condition if:  You are a woman.  You are 50 years of age or older. What are the signs or symptoms? Symptoms of this condition usually happen when you move your head or your eyes in different directions. Symptoms may start suddenly, and usually last for less than a minute. They include:  Loss of balance and falling.  Feeling like you are spinning or moving.  Feeling like your surroundings are spinning or moving.  Nausea and vomiting.  Blurred vision.  Dizziness.  Involuntary eye movement (nystagmus). Symptoms can be mild and cause only minor problems, or they can be severe and interfere with daily life. Episodes of benign positional vertigo may return (recur) over time. Symptoms may improve over time. How is this diagnosed? This condition may be diagnosed based on:  Your medical history.  Physical exam of the head, neck, and ears.  Tests, such as: ? MRI. ? CT scan. ? Eye movement tests. Your health care provider may ask you to change positions quickly while he or she watches you for symptoms  of benign positional vertigo, such as nystagmus. Eye movement may be tested with a variety of exams that are designed to evaluate or stimulate vertigo. ? An electroencephalogram (EEG). This records electrical activity in your brain. ? Hearing tests. You may be referred to a health care provider who specializes in ear, nose, and throat (ENT) problems (otolaryngologist) or a provider who specializes in disorders of the nervous system (neurologist). How is this treated?  This condition may be treated in a session in which your health care provider moves your head in specific positions to adjust your inner ear back to normal. Treatment for this condition may take several sessions. Surgery may be needed in severe cases, but this is rare. In some cases, benign positional vertigo may resolve on its own in 2-4 weeks. Follow these instructions at home: Safety  Move slowly. Avoid sudden body or head movements or certain positions, as told by your health care provider.  Avoid driving until your health care provider says it is safe for you to do so.  Avoid operating heavy machinery until your health care provider says it is safe for you to do so.  Avoid doing any tasks that would be dangerous to you or others if vertigo occurs.  If you have trouble walking or keeping your balance, try using a cane for stability. If you feel dizzy or unstable, sit down right away.  Return to your normal activities as told by your health care provider. Ask your health care provider what activities are safe for you. General instructions  Take over-the-counter   and prescription medicines only as told by your health care provider.  Drink enough fluid to keep your urine pale yellow.  Keep all follow-up visits as told by your health care provider. This is important. Contact a health care provider if:  You have a fever.  Your condition gets worse or you develop new symptoms.  Your family or friends notice any  behavioral changes.  You have nausea or vomiting that gets worse.  You have numbness or a "pins and needles" sensation. Get help right away if you:  Have difficulty speaking or moving.  Are always dizzy.  Faint.  Develop severe headaches.  Have weakness in your legs or arms.  Have changes in your hearing or vision.  Develop a stiff neck.  Develop sensitivity to light. Summary  Vertigo is the feeling that you or your surroundings are moving when they are not. Benign positional vertigo is the most common form of vertigo.  The cause of this condition is not known. It may be caused by a disturbance in an area of the inner ear that helps your brain to sense movement and balance.  Symptoms include loss of balance and falling, feeling that you or your surroundings are moving, nausea and vomiting, and blurred vision.  This condition can be diagnosed based on symptoms, physical exam, and other tests, such as MRI, CT scan, eye movement tests, and hearing tests.  Follow safety instructions as told by your health care provider. You will also be told when to contact your health care provider in case of problems. This information is not intended to replace advice given to you by your health care provider. Make sure you discuss any questions you have with your health care provider. Document Released: 01/21/2006 Document Revised: 09/24/2017 Document Reviewed: 09/24/2017 Elsevier Patient Education  2020 [employer]  

## 2019-04-26 NOTE — Progress Notes (Signed)
HPI: Zachary Harvey is a 58 y.o. 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  he presents to [facility] today, 04/26/19,  for chief complaint of:  Vertigo    History of vertigo, this feels similar  Balance is bad in the AM especially  Head movements exacerbate dizziness  Closing eyes makes it worse also   Orthostatic VS as below, not strongly positive but reports dizziness w/ lying down     At today's visit 04/26/19 ... PMH, PSH, FH reviewed and updated as needed.  Current medication list and allergy/intolerance hx reviewed and updated as needed. (See remainder of HPI, ROS, Phys Exam below)          ASSESSMENT/PLAN: The primary encounter diagnosis was Annual physical exam. Diagnoses of Benign paroxysmal positional vertigo of right ear and Type 2 diabetes mellitus without complication, without long-term current use of insulin ([place]) were also pertinent to this visit.  Positive provocative maneuvers with Dix-Hallpike, no resolution of symptoms.  Patient advised to continue modified Epley maneuvers at home, he states that he knows how to do these when I reviewed instructions with him in the office.  Will trial meclizine, may consider Valium if severe symptoms, no red flags for Mnire's disease, other neurologic process such as CVA/mass affect    Orders Placed This Encounter  Procedures  . CBC  . COMPLETE METABOLIC PANEL WITH GFR  . LIPID SCREENING  . PSA SOLSTAS  . Hemoglobin A1c     Meds ordered this encounter  Medications  . meclizine (ANTIVERT) 25 MG  tablet    Sig: Take 1 tablet (25 mg total) by mouth 3 (three) times daily as needed for dizziness.    Dispense:  30 tablet    Refill:  1    Patient Instructions  Benign Positional Vertigo Vertigo is the feeling that you or your surroundings are moving when they are not. Benign positional vertigo is the most common form of vertigo. This is usually a harmless condition (benign). This condition is positional. This means that symptoms are triggered by certain movements and positions. This condition can be dangerous if it occurs while you are doing something that could cause harm to you or others. This includes activities such as driving or operating machinery. What are the causes? In many cases, the cause of this condition is not known. It may be caused by a disturbance in an area of the inner ear that helps your brain to sense movement and balance. This disturbance can be caused by:  Viral infection (labyrinthitis).  Head injury.  Repetitive motion, such as jumping, dancing, or running. What increases the risk? You are more likely to develop this condition if:  You are a woman.  You are 5 years of age or older. What are the signs or symptoms? Symptoms of this condition usually happen when you move your head or your eyes in different directions. Symptoms may start suddenly, and usually last for less than a minute. They include:  Loss of balance and falling.  Feeling like you are spinning or moving.  Feeling like your surroundings are spinning or moving.  Nausea and vomiting.  Blurred vision.  Dizziness.  Involuntary eye movement (nystagmus). Symptoms can be mild and cause  only minor problems, or they can be severe and interfere with daily life. Episodes of benign positional vertigo may return (recur) over time. Symptoms may improve over time. How is this diagnosed? This condition may be diagnosed based on:  Your medical history.  Physical exam of the head, neck, and  ears.  Tests, such as: ? MRI. ? CT scan. ? Eye movement tests. Your health care provider may ask you to change positions quickly while he or she watches you for symptoms of benign positional vertigo, such as nystagmus. Eye movement may be tested with a variety of exams that are designed to evaluate or stimulate vertigo. ? An electroencephalogram (EEG). This records electrical activity in your brain. ? Hearing tests. You may be referred to a health care provider who specializes in ear, nose, and throat (ENT) problems (otolaryngologist) or a provider who specializes in disorders of the nervous system (neurologist). How is this treated?  This condition may be treated in a session in which your health care provider moves your head in specific positions to adjust your inner ear back to normal. Treatment for this condition may take several sessions. Surgery may be needed in severe cases, but this is rare. In some cases, benign positional vertigo may resolve on its own in 2-4 weeks. Follow these instructions at home: Safety  Move slowly. Avoid sudden body or head movements or certain positions, as told by your health care provider.  Avoid driving until your health care provider says it is safe for you to do so.  Avoid operating heavy machinery until your health care provider says it is safe for you to do so.  Avoid doing any tasks that would be dangerous to you or others if vertigo occurs.  If you have trouble walking or keeping your balance, try using a cane for stability. If you feel dizzy or unstable, sit down right away.  Return to your normal activities as told by your health care provider. Ask your health care provider what activities are safe for you. General instructions  Take over-the-counter and prescription medicines only as told by your health care provider.  Drink enough fluid to keep your urine pale yellow.  Keep all follow-up visits as told by your health care provider. This  is important. Contact a health care provider if:  You have a fever.  Your condition gets worse or you develop new symptoms.  Your family or friends notice any behavioral changes.  You have nausea or vomiting that gets worse.  You have numbness or a "pins and needles" sensation. Get help right away if you:  Have difficulty speaking or moving.  Are always dizzy.  Faint.  Develop severe headaches.  Have weakness in your legs or arms.  Have changes in your hearing or vision.  Develop a stiff neck.  Develop sensitivity to light. Summary  Vertigo is the feeling that you or your surroundings are moving when they are not. Benign positional vertigo is the most common form of vertigo.  The cause of this condition is not known. It may be caused by a disturbance in an area of the inner ear that helps your brain to sense movement and balance.  Symptoms include loss of balance and falling, feeling that you or your surroundings are moving, nausea and vomiting, and blurred vision.  This condition can be diagnosed based on symptoms, physical exam, and other tests, such as MRI, CT scan, eye movement tests, and hearing tests.  Follow safety instructions as told by  your health care provider. You will also be told when to contact your health care provider in case of problems. This information is not intended to replace advice given to you by your health care provider. Make sure you discuss any questions you have with your health care provider. Document Released: 01/21/2006 Document Revised: 09/24/2017 Document Reviewed: 09/24/2017 Elsevier Patient Education  [place].      Follow-up plan: Return in about 3 months (around 07/25/2019) for [place] (get labs prior to visit, orders are  in).                                                 ################################################# ################################################# ################################################# #################################################    Current Meds  Medication Sig  . AMBULATORY NON FORMULARY MEDICATION Single glucometer with lancets, test strips  . atorvastatin (LIPITOR) 80 MG tablet Take 80 mg by mouth daily.  . betamethasone dipropionate 0.05 % cream Apply topically 2 (two) times daily. To affected area(s) as needed  . blood glucose meter kit and supplies KIT Check morning fasting glucose daily and up to four times daily as needed  . dapagliflozin propanediol (FARXIGA) 10 MG TABS tablet Take 10 mg by mouth daily.  . metoprolol tartrate (LOPRESSOR) 50 MG tablet Take 50 mg by mouth 2 (two) times daily.  Glory Rosebush ULTRA test strip CHECK MORNING FASTING GLUCOSE DAILY AND UP TO 4 TIMES DAILY AS NEEDED    No Known Allergies     Review of Systems:  Constitutional: No recent illness  HEENT: No  headache, no vision change  Cardiac: No  chest pain, No  pressure, No palpitations  Respiratory:  No  shortness of breath. No  Cough  Gastrointestinal: No  abdominal pain, no change on bowel habits  Neurologic: No  weakness, +Dizziness  Psychiatric: No  concerns with depression, No  concerns with anxiety  Exam:  BP 100/64 (BP Location: Left Arm, Patient Position: Sitting, Cuff Size: Normal)   Pulse 73   Temp 98.2 F (36.8 C) (Oral)   Wt 178 lb 0.6 oz (80.8 kg)   BMI 27.89 kg/m   Constitutional: VS see above. General Appearance: alert, well-developed, well-nourished, NAD  Eyes: Normal lids and conjunctive, non-icteric sclera  Neck: No masses, trachea midline.   Respiratory: Normal respiratory effort. no wheeze, no rhonchi, no rales  Cardiovascular: S1/S2 normal, no murmur, no rub/gallop auscultated. RRR.    Musculoskeletal: Gait normal. Symmetric and independent movement of all extremities  Abdominal: non-tender, non-distended, no appreciable organomegaly, neg Murphy's, BS [id number]  Neurological: Normal balance/coordination. No tremor.  Positive Dix-Hallpike maneuver with reproduction of symptoms to the right  Skin: warm, dry, intact.   Psychiatric: Normal judgment/insight. Normal mood and affect. Oriented x3.       Visit summary with medication list and pertinent instructions was printed for patient to review, patient was advised to alert [place] if any updates are needed. All questions at time of visit were answered - patient instructed to contact office with any additional concerns. ER/RTC precautions were reviewed with the patient and understanding verbalized.   Note: Total time spent 25 minutes, greater than 50% of the visit was spent face-to-face counseling and coordinating care for the following: The primary encounter diagnosis was Annual physical exam. Diagnoses of Benign paroxysmal positional vertigo of right ear and Type 2 diabetes mellitus without complication, without long-term current use  of insulin ([place]) were also pertinent to this visit.Marland Kitchen  Please note: voice recognition software was used to produce this document, and typos may escape review. Please contact Dr. Sheppard Coil for any needed clarifications.    Follow up plan: Return in about 3 months (around 07/25/2019) for [place] (get labs prior to visit, orders are in).

## 2019-04-27 DIAGNOSIS — I252 Old myocardial infarction: Secondary | ICD-10-CM | POA: Diagnosis not present

## 2019-04-27 DIAGNOSIS — Z48812 Encounter for surgical aftercare following surgery on the circulatory system: Secondary | ICD-10-CM | POA: Diagnosis not present

## 2019-04-27 DIAGNOSIS — Z951 Presence of aortocoronary bypass graft: Secondary | ICD-10-CM | POA: Diagnosis not present

## 2019-04-27 DIAGNOSIS — D649 Anemia, unspecified: Secondary | ICD-10-CM | POA: Diagnosis not present

## 2019-04-27 DIAGNOSIS — R5383 Other fatigue: Secondary | ICD-10-CM | POA: Diagnosis not present

## 2019-05-04 DIAGNOSIS — Z951 Presence of aortocoronary bypass graft: Secondary | ICD-10-CM | POA: Diagnosis not present

## 2019-05-04 DIAGNOSIS — Z48812 Encounter for surgical aftercare following surgery on the circulatory system: Secondary | ICD-10-CM | POA: Diagnosis not present

## 2019-05-06 DIAGNOSIS — Z951 Presence of aortocoronary bypass graft: Secondary | ICD-10-CM | POA: Diagnosis not present

## 2019-05-06 DIAGNOSIS — Z48812 Encounter for surgical aftercare following surgery on the circulatory system: Secondary | ICD-10-CM | POA: Diagnosis not present

## 2019-05-11 DIAGNOSIS — Z48812 Encounter for surgical aftercare following surgery on the circulatory system: Secondary | ICD-10-CM | POA: Diagnosis not present

## 2019-05-11 DIAGNOSIS — Z951 Presence of aortocoronary bypass graft: Secondary | ICD-10-CM | POA: Diagnosis not present

## 2019-05-13 DIAGNOSIS — Z48812 Encounter for surgical aftercare following surgery on the circulatory system: Secondary | ICD-10-CM | POA: Diagnosis not present

## 2019-05-13 DIAGNOSIS — Z951 Presence of aortocoronary bypass graft: Secondary | ICD-10-CM | POA: Diagnosis not present

## 2019-05-14 DIAGNOSIS — Z951 Presence of aortocoronary bypass graft: Secondary | ICD-10-CM | POA: Diagnosis not present

## 2019-05-14 DIAGNOSIS — Z48812 Encounter for surgical aftercare following surgery on the circulatory system: Secondary | ICD-10-CM | POA: Diagnosis not present

## 2019-05-18 DIAGNOSIS — Z951 Presence of aortocoronary bypass graft: Secondary | ICD-10-CM | POA: Diagnosis not present

## 2019-05-18 DIAGNOSIS — Z48812 Encounter for surgical aftercare following surgery on the circulatory system: Secondary | ICD-10-CM | POA: Diagnosis not present

## 2019-05-19 DIAGNOSIS — Z79899 Other long term (current) drug therapy: Secondary | ICD-10-CM | POA: Diagnosis not present

## 2019-05-19 DIAGNOSIS — I2510 Atherosclerotic heart disease of native coronary artery without angina pectoris: Secondary | ICD-10-CM | POA: Diagnosis not present

## 2019-05-19 DIAGNOSIS — I252 Old myocardial infarction: Secondary | ICD-10-CM | POA: Diagnosis not present

## 2019-05-19 DIAGNOSIS — Z7982 Long term (current) use of aspirin: Secondary | ICD-10-CM | POA: Diagnosis not present

## 2019-05-19 DIAGNOSIS — Z951 Presence of aortocoronary bypass graft: Secondary | ICD-10-CM | POA: Diagnosis not present

## 2019-05-19 DIAGNOSIS — I10 Essential (primary) hypertension: Secondary | ICD-10-CM | POA: Diagnosis not present

## 2019-05-20 DIAGNOSIS — R079 Chest pain, unspecified: Secondary | ICD-10-CM | POA: Diagnosis not present

## 2019-05-20 DIAGNOSIS — Z951 Presence of aortocoronary bypass graft: Secondary | ICD-10-CM | POA: Diagnosis not present

## 2019-05-20 DIAGNOSIS — I2510 Atherosclerotic heart disease of native coronary artery without angina pectoris: Secondary | ICD-10-CM | POA: Diagnosis not present

## 2019-05-20 DIAGNOSIS — Z48812 Encounter for surgical aftercare following surgery on the circulatory system: Secondary | ICD-10-CM | POA: Diagnosis not present

## 2019-05-25 DIAGNOSIS — Z48812 Encounter for surgical aftercare following surgery on the circulatory system: Secondary | ICD-10-CM | POA: Diagnosis not present

## 2019-05-25 DIAGNOSIS — Z951 Presence of aortocoronary bypass graft: Secondary | ICD-10-CM | POA: Diagnosis not present

## 2019-05-27 DIAGNOSIS — Z951 Presence of aortocoronary bypass graft: Secondary | ICD-10-CM | POA: Diagnosis not present

## 2019-05-27 DIAGNOSIS — Z48812 Encounter for surgical aftercare following surgery on the circulatory system: Secondary | ICD-10-CM | POA: Diagnosis not present

## 2019-05-28 DIAGNOSIS — Z951 Presence of aortocoronary bypass graft: Secondary | ICD-10-CM | POA: Diagnosis not present

## 2019-05-28 DIAGNOSIS — Z48812 Encounter for surgical aftercare following surgery on the circulatory system: Secondary | ICD-10-CM | POA: Diagnosis not present

## 2019-06-01 DIAGNOSIS — Z951 Presence of aortocoronary bypass graft: Secondary | ICD-10-CM | POA: Diagnosis not present

## 2019-06-01 DIAGNOSIS — Z48812 Encounter for surgical aftercare following surgery on the circulatory system: Secondary | ICD-10-CM | POA: Diagnosis not present

## 2019-06-04 DIAGNOSIS — Z48812 Encounter for surgical aftercare following surgery on the circulatory system: Secondary | ICD-10-CM | POA: Diagnosis not present

## 2019-06-04 DIAGNOSIS — Z951 Presence of aortocoronary bypass graft: Secondary | ICD-10-CM | POA: Diagnosis not present

## 2019-06-05 ENCOUNTER — Other Ambulatory Visit: Payer: Self-pay | Admitting: Physician Assistant

## 2019-06-05 DIAGNOSIS — E1165 Type 2 diabetes mellitus with hyperglycemia: Secondary | ICD-10-CM

## 2019-06-10 DIAGNOSIS — Z951 Presence of aortocoronary bypass graft: Secondary | ICD-10-CM | POA: Diagnosis not present

## 2019-06-10 DIAGNOSIS — I071 Rheumatic tricuspid insufficiency: Secondary | ICD-10-CM | POA: Diagnosis not present

## 2019-06-10 DIAGNOSIS — I2510 Atherosclerotic heart disease of native coronary artery without angina pectoris: Secondary | ICD-10-CM | POA: Diagnosis not present

## 2019-06-10 DIAGNOSIS — Z48812 Encounter for surgical aftercare following surgery on the circulatory system: Secondary | ICD-10-CM | POA: Diagnosis not present

## 2019-06-10 DIAGNOSIS — R0600 Dyspnea, unspecified: Secondary | ICD-10-CM | POA: Diagnosis not present

## 2019-06-10 DIAGNOSIS — R072 Precordial pain: Secondary | ICD-10-CM | POA: Diagnosis not present

## 2019-06-22 DIAGNOSIS — Z48812 Encounter for surgical aftercare following surgery on the circulatory system: Secondary | ICD-10-CM | POA: Diagnosis not present

## 2019-06-22 DIAGNOSIS — Z951 Presence of aortocoronary bypass graft: Secondary | ICD-10-CM | POA: Diagnosis not present

## 2019-06-24 DIAGNOSIS — I2510 Atherosclerotic heart disease of native coronary artery without angina pectoris: Secondary | ICD-10-CM | POA: Diagnosis not present

## 2019-06-24 DIAGNOSIS — L910 Hypertrophic scar: Secondary | ICD-10-CM | POA: Diagnosis not present

## 2019-06-24 DIAGNOSIS — R0789 Other chest pain: Secondary | ICD-10-CM | POA: Diagnosis not present

## 2019-06-24 DIAGNOSIS — Z951 Presence of aortocoronary bypass graft: Secondary | ICD-10-CM | POA: Diagnosis not present

## 2019-06-24 DIAGNOSIS — R918 Other nonspecific abnormal finding of lung field: Secondary | ICD-10-CM | POA: Diagnosis not present

## 2019-06-25 DIAGNOSIS — Z951 Presence of aortocoronary bypass graft: Secondary | ICD-10-CM | POA: Diagnosis not present

## 2019-06-25 DIAGNOSIS — Z48812 Encounter for surgical aftercare following surgery on the circulatory system: Secondary | ICD-10-CM | POA: Diagnosis not present

## 2019-06-29 DIAGNOSIS — Z48812 Encounter for surgical aftercare following surgery on the circulatory system: Secondary | ICD-10-CM | POA: Diagnosis not present

## 2019-06-29 DIAGNOSIS — Z951 Presence of aortocoronary bypass graft: Secondary | ICD-10-CM | POA: Diagnosis not present

## 2019-07-01 DIAGNOSIS — Z951 Presence of aortocoronary bypass graft: Secondary | ICD-10-CM | POA: Diagnosis not present

## 2019-07-01 DIAGNOSIS — Z48812 Encounter for surgical aftercare following surgery on the circulatory system: Secondary | ICD-10-CM | POA: Diagnosis not present

## 2019-07-07 ENCOUNTER — Other Ambulatory Visit: Payer: Self-pay | Admitting: Physician Assistant

## 2019-07-07 DIAGNOSIS — E1165 Type 2 diabetes mellitus with hyperglycemia: Secondary | ICD-10-CM

## 2019-07-08 DIAGNOSIS — Z951 Presence of aortocoronary bypass graft: Secondary | ICD-10-CM | POA: Diagnosis not present

## 2019-07-08 DIAGNOSIS — Z48812 Encounter for surgical aftercare following surgery on the circulatory system: Secondary | ICD-10-CM | POA: Diagnosis not present

## 2019-07-09 DIAGNOSIS — Z951 Presence of aortocoronary bypass graft: Secondary | ICD-10-CM | POA: Diagnosis not present

## 2019-07-09 DIAGNOSIS — Z48812 Encounter for surgical aftercare following surgery on the circulatory system: Secondary | ICD-10-CM | POA: Diagnosis not present

## 2019-07-13 DIAGNOSIS — Z951 Presence of aortocoronary bypass graft: Secondary | ICD-10-CM | POA: Diagnosis not present

## 2019-07-13 DIAGNOSIS — Z48812 Encounter for surgical aftercare following surgery on the circulatory system: Secondary | ICD-10-CM | POA: Diagnosis not present

## 2019-07-16 DIAGNOSIS — Z48812 Encounter for surgical aftercare following surgery on the circulatory system: Secondary | ICD-10-CM | POA: Diagnosis not present

## 2019-07-16 DIAGNOSIS — Z951 Presence of aortocoronary bypass graft: Secondary | ICD-10-CM | POA: Diagnosis not present

## 2019-07-20 DIAGNOSIS — I259 Chronic ischemic heart disease, unspecified: Secondary | ICD-10-CM | POA: Diagnosis not present

## 2019-07-21 DIAGNOSIS — Z9889 Other specified postprocedural states: Secondary | ICD-10-CM | POA: Diagnosis not present

## 2019-07-21 DIAGNOSIS — I259 Chronic ischemic heart disease, unspecified: Secondary | ICD-10-CM | POA: Diagnosis not present

## 2019-07-21 DIAGNOSIS — I208 Other forms of angina pectoris: Secondary | ICD-10-CM | POA: Diagnosis not present

## 2019-08-12 ENCOUNTER — Other Ambulatory Visit: Payer: Self-pay | Admitting: Osteopathic Medicine

## 2019-08-12 DIAGNOSIS — E1165 Type 2 diabetes mellitus with hyperglycemia: Secondary | ICD-10-CM

## 2019-08-24 ENCOUNTER — Encounter: Payer: Self-pay | Admitting: Nurse Practitioner

## 2019-08-24 ENCOUNTER — Ambulatory Visit (INDEPENDENT_AMBULATORY_CARE_PROVIDER_SITE_OTHER): Payer: BC Managed Care – PPO | Admitting: Nurse Practitioner

## 2019-08-24 ENCOUNTER — Other Ambulatory Visit: Payer: Self-pay

## 2019-08-24 VITALS — BP 139/86 | HR 76 | Ht 67.0 in | Wt 172.0 lb

## 2019-08-24 DIAGNOSIS — K649 Unspecified hemorrhoids: Secondary | ICD-10-CM | POA: Diagnosis not present

## 2019-08-24 DIAGNOSIS — E1165 Type 2 diabetes mellitus with hyperglycemia: Secondary | ICD-10-CM | POA: Diagnosis not present

## 2019-08-24 LAB — POCT GLYCOSYLATED HEMOGLOBIN (HGB A1C): Hemoglobin A1C: 6.6 % — AB (ref 4.0–5.6)

## 2019-08-24 LAB — POCT UA - MICROALBUMIN
Albumin/Creatinine Ratio, Urine, POC: <30
Creatinine, POC: 50 mg/dL
Microalbumin Ur, POC: 10 mg/L

## 2019-08-24 MED ORDER — FARXIGA 10 MG PO TABS: 10.0000 mg | ORAL_TABLET | Freq: Every day | ORAL | 1 refills | Status: DC

## 2019-08-24 MED ORDER — LIDOCAINE-HYDROCORTISONE ACE 3-0.5 % RE KIT: 1.0000 "application " | PACK | Freq: Two times a day (BID) | RECTAL | 4 refills | Status: DC | PRN

## 2019-08-24 MED ORDER — NITROGLYCERIN 0.4 % RE OINT: 1.0000 [in_us] | TOPICAL_OINTMENT | Freq: Two times a day (BID) | RECTAL | 3 refills | Status: DC | PRN

## 2019-08-24 NOTE — Patient Instructions (Signed)
Diabetes Basics  Diabetes (diabetes mellitus) is a long-term (chronic) disease. It occurs when the body does not properly use sugar (glucose) that is released from food after you eat. Diabetes may be caused by one or both of these problems:  Your pancreas does not make enough of a hormone called insulin.  Your body does not react in a normal way to insulin that it makes. Insulin lets sugars (glucose) go into cells in your body. This gives you energy. If you have diabetes, sugars cannot get into cells. This causes high blood sugar (hyperglycemia). Follow these instructions at home: How is diabetes treated? You may need to take insulin or other diabetes medicines daily to keep your blood sugar in balance. Take your diabetes medicines every day as told by your doctor. List your diabetes medicines here: Diabetes medicines How do I manage my blood sugar?  Check your blood sugar levels using a blood glucose monitor as directed by your doctor. Your doctor will set treatment goals for you. Generally, you should have these blood sugar levels:  Before meals (preprandial): 80-130 mg/dL (3.7-4.8 mmol/L).  After meals (postprandial): below 180 mg/dL (10 mmol/L).  A1c level: less than 7%. Write down the times that you will check your blood sugar levels:  What do I need to know about low blood sugar? Low blood sugar is called hypoglycemia. This is when blood sugar is at or below 70 mg/dL (3.9 mmol/L). Symptoms may include:  Feeling: ? Hungry. ? Worried or nervous (anxious). ? Sweaty and clammy. ? Confused. ? Dizzy. ? Sleepy. ? Sick to your stomach (nauseous).  Having: ? A fast heartbeat. ? A headache. ? A change in your vision. ? Tingling or no feeling (numbness) around the mouth, lips, or tongue. ? Jerky movements that you cannot control (seizure).  Having trouble with: ? Moving (coordination). ? Sleeping. ? Passing out (fainting). ? Getting upset easily (irritability). Treating  low blood sugar To treat low blood sugar, eat or drink something sugary right away. If you can think clearly and swallow safely, follow the 15:15 rule:  Take 15 grams of a fast-acting carb (carbohydrate). Talk with your doctor about how much you should take.  Some fast-acting carbs are: ? Sugar tablets (glucose pills). Take 3-4 glucose pills. ? 6-8 pieces of hard candy. ? 4-6 oz (120-150 mL) of fruit juice. ? 4-6 oz (120-150 mL) of regular (not diet) soda. ? 1 Tbsp (15 mL) honey or sugar.  Check your blood sugar 15 minutes after you take the carb.  If your blood sugar is still at or below 70 mg/dL (3.9 mmol/L), take 15 grams of a carb again.  If your blood sugar does not go above 70 mg/dL (3.9 mmol/L) after 3 tries, get help right away.  After your blood sugar goes back to normal, eat a meal or a snack within 1 hour. Treating very low blood sugar If your blood sugar is at or below 54 mg/dL (3 mmol/L), you have very low blood sugar (severe hypoglycemia). This is an emergency. Do not wait to see if the symptoms will go away. Get medical help right away. Call your local emergency services (911 in the [place] Do not drive yourself to the hospital. Questions to ask your health care provider  Do I need to meet with a diabetes educator?  What equipment will I need to care for myself at home?  What diabetes medicines do I need? When should I take them?  How often do I need  to check my blood sugar?  What number can I call if I have questions?  When is my next doctor's visit?  Where can I find a support group for people with diabetes? Where to find more information  American Diabetes Association: www.diabetes.org  American Association of Diabetes Educators: www.diabeteseducator.org/patient-resources Contact a doctor if:  Your blood sugar is at or above 240 mg/dL (36.6 mmol/L) for 2 days in a row.  You have been sick or have had a fever for 2 days or more, and you are not getting  better.  You have any of these problems for more than 6 hours: ? You cannot eat or drink. ? You feel sick to your stomach (nauseous). ? You throw up (vomit). ? You have watery poop (diarrhea). Get help right away if:  Your blood sugar is lower than 54 mg/dL (3 mmol/L).  You get confused.  You have trouble: ? Thinking clearly. ? Breathing. Summary  Diabetes (diabetes mellitus) is a long-term (chronic) disease. It occurs when the body does not properly use sugar (glucose) that is released from food after digestion.  Take insulin and diabetes medicines as told.  Check your blood sugar every day, as often as told.  Keep all follow-up visits as told by your doctor. This is important. This information is not intended to replace advice given to you by your health care provider. Make sure you discuss any questions you have with your health care provider. Document Revised: 01/06/2019 Document Reviewed: 07/18/2017 Elsevier Patient Education  2020 [employer].   Hemorrhoids Hemorrhoids are swollen veins that may develop:  In the butt (rectum). These are called internal hemorrhoids.  Around the opening of the butt (anus). These are called external hemorrhoids. Hemorrhoids can cause pain, itching, or bleeding. Most of the time, they do not cause serious problems. They usually get better with diet changes, lifestyle changes, and other home treatments. What are the causes? This condition may be caused by:  Having trouble pooping (constipation).  Pushing hard (straining) to poop.  Watery poop (diarrhea).  Pregnancy.  Being very overweight (obese).  Sitting for long periods of time.  Heavy lifting or other activity that causes you to strain.  Anal sex.  Riding a bike for a long period of time. What are the signs or symptoms? Symptoms of this condition include:  Pain.  Itching or soreness in the butt.  Bleeding from the butt.  Leaking poop.  Swelling in the area.   One or more lumps around the opening of your butt. How is this diagnosed? A doctor can often diagnose this condition by looking at the affected area. The doctor may also:  Do an exam that involves feeling the area with a gloved hand (digital rectal exam).  Examine the area inside your butt using a small tube (anoscope).  Order blood tests. This may be done if you have lost a lot of blood.  Have you get a test that involves looking inside the colon using a flexible tube with a camera on the end (sigmoidoscopy or colonoscopy). How is this treated? This condition can usually be treated at home. Your doctor may tell you to change what you eat, make lifestyle changes, or try home treatments. If these do not help, procedures can be done to remove the hemorrhoids or make them smaller. These may involve:  Placing rubber bands at the base of the hemorrhoids to cut off their blood supply.  Injecting medicine into the hemorrhoids to shrink them.  Shining  a type of light energy onto the hemorrhoids to cause them to fall off.  Doing surgery to remove the hemorrhoids or cut off their blood supply. Follow these instructions at home: Eating and drinking   Eat foods that have a lot of fiber in them. These include whole grains, beans, nuts, fruits, and vegetables.  Ask your doctor about taking products that have added fiber (fibersupplements).  Reduce the amount of fat in your diet. You can do this by: ? Eating low-fat dairy products. ? Eating less red meat. ? Avoiding processed foods.  Drink enough fluid to keep your pee (urine) pale yellow. Managing pain and swelling   Take a warm-water bath (sitz bath) for 20 minutes to ease pain. Do this 3-4 times a day. You may do this in a bathtub or using a portable sitz bath that fits over the toilet.  If told, put ice on the painful area. It may be helpful to use ice between your warm baths. ? Put ice in a plastic bag. ? Place a towel between your  skin and the bag. ? Leave the ice on for 20 minutes, 2-3 times a day. General instructions  Take over-the-counter and prescription medicines only as told by your doctor. ? Medicated creams and medicines may be used as told.  Exercise often. Ask your doctor how much and what kind of exercise is best for you.  Go to the bathroom when you have the urge to poop. Do not wait.  Avoid pushing too hard when you poop.  Keep your butt dry and clean. Use wet toilet paper or moist towelettes after pooping.  Do not sit on the toilet for a long time.  Keep all follow-up visits as told by your doctor. This is important. Contact a doctor if you:  Have pain and swelling that do not get better with treatment or medicine.  Have trouble pooping.  Cannot poop.  Have pain or swelling outside the area of the hemorrhoids. Get help right away if you have:  Bleeding that will not stop. Summary  Hemorrhoids are swollen veins in the butt or around the opening of the butt.  They can cause pain, itching, or bleeding.  Eat foods that have a lot of fiber in them. These include whole grains, beans, nuts, fruits, and vegetables.  Take a warm-water bath (sitz bath) for 20 minutes to ease pain. Do this 3-4 times a day. This information is not intended to replace advice given to you by your health care provider. Make sure you discuss any questions you have with your health care provider. Document Revised: 04/23/2018 Document Reviewed: 09/04/2017 Elsevier Patient Education  [place].

## 2019-08-24 NOTE — Progress Notes (Signed)
Established Patient Office Visit  Subjective:  Patient ID: Zachary Harvey, male    DOB: 10/21/1960  Age: 59 y.o. MRN: [id number]  CC:  Chief Complaint  Patient presents with  . Diabetes    HPI Zachary Harvey presents for follow-up for diabetes follow-up and to discuss hemorrhoids.   DIABETES Taking medications as prescribed: yes Glucose Monitoring: no  Accucheck frequency: Not Checking Hypoglycemic episodes:no Polydipsia/polyuria: no Visual changes: no Chest pain: no Paresthesias: no Blood Pressure Monitoring: not checking Retinal Examination: Up to Date Foot Exam: Up to Date Diabetic Education: Not Completed Pneumovax: Up to Date Influenza: unknown Aspirin: no He reports he is taking his Farxiga as prescribed and is tolerating this medication well.  He was previously on Metformin however it caused significant diarrhea and he was unable to tolerate this.  He denies any concerns with his diabetes today.  Hemorrhoids Patient reports ongoing issue with external hemorrhoids for the past several years.  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  He has tried Tucks pads, Proctofoam, and Anusol suppositories.  He reports these are somewhat effective but he would like to find something that he could use externally that would be a little more effective for his symptoms.  He reports pain, burning, and rectal itching when the hemorrhoids are present.  He does have occasions where he does not have hemorrhoids present but he feels they are there more often than not.  He denies constipation, straining, or difficulty passing stools.  He reports his stools are typically soft and formed.  He is not interested in additional surgical removal at this time.  Past Medical History:  Diagnosis  Date  . Allergy   . BPPV (benign paroxysmal positional vertigo)   . CAD (coronary artery disease)   . Combined nonsenile cataract 01/09/2017  . Dermatochalasis 01/09/2017   Observation per Dr. Hageman  . Diabetes mellitus without complication (HCC)   . GERD (gastroesophageal reflux disease)   . Hyperlipidemia   . Hypertension   . Myocardial infarction (HCC)   . Posterior vitreous detachment of left eye 01/09/2017  . Transaminitis 06/30/2018    Past Surgical History:  Procedure Laterality Date  . CORONARY/GRAFT ACUTE MI REVASCULARIZATION  2012    Family History  Problem Relation Age of Onset  . Diabetes Mother   . Heart disease Mother   . Kidney disease Mother     Social History   Socioeconomic History  . Marital status: Married    Spouse name: Not on file  . Number of children: Not on file  . Years of education: Not on file  . Highest education level: Not on file  Occupational History  . Not on file  Tobacco Use  . Smoking status: Never Smoker  . Smokeless tobacco: Never Used  Substance and Sexual Activity  . Alcohol use: No  . Drug use: No  . Sexual activity: Yes    Birth control/protection: None  Other Topics Concern  . Not on file  Social History Narrative  . Not on file   Social Determinants of Health   Financial Resource Strain:   . Difficulty of Paying Living Expenses:   Food Insecurity:   . Worried About Running Out of Food in the Last Year:   .   Ran Out of Food in the Last Year:   Transportation Needs:   . Lack of Transportation (Medical):   . Lack of Transportation (Non-Medical):   Physical Activity:   . Days of Exercise per Week:   . Minutes of Exercise per Session:   Stress:   . Feeling of Stress :   Social Connections:   . Frequency of Communication with Friends and Family:   . Frequency of Social Gatherings with Friends and Family:   . Attends Religious Services:   . Active Member of Clubs or Organizations:   . Attends Club or Organization  Meetings:   . Marital Status:   Intimate Partner Violence:   . Fear of Current or Ex-Partner:   . Emotionally Abused:   . Physically Abused:   . Sexually Abused:     Outpatient Medications Prior to Visit  Medication Sig Dispense Refill  . AMBULATORY NON FORMULARY MEDICATION Single glucometer with lancets, test strips 1 each 0  . aspirin 81 MG tablet Take 81 mg by mouth daily.    . atorvastatin (LIPITOR) 80 MG tablet Take 80 mg by mouth daily.    . betamethasone dipropionate 0.05 % cream Apply topically 2 (two) times daily. To affected area(s) as needed 45 g 1  . blood glucose meter kit and supplies KIT Check morning fasting glucose daily and up to four times daily as needed 1 each 0  . FARXIGA 10 MG TABS tablet Take 1 tablet by mouth once daily 15 tablet 0  . fexofenadine (ALLEGRA) 180 MG tablet Take 180 mg by mouth daily.    . meclizine (ANTIVERT) 25 MG tablet Take 1 tablet (25 mg total) by mouth 3 (three) times daily as needed for dizziness. 30 tablet 1  . metoprolol tartrate (LOPRESSOR) 50 MG tablet Take 50 mg by mouth 2 (two) times daily.    . ONETOUCH ULTRA test strip CHECK MORNING FASTING GLUCOSE DAILY AND UP TO 4 TIMES DAILY AS NEEDED 50 each 0  . oxycodone (OXY-IR) 5 MG capsule Take 5 mg by mouth every 4 (four) hours as needed.     No facility-administered medications prior to visit.    No Known Allergies  ROS Review of Systems  Constitutional: Negative for appetite change, chills, fatigue, fever and unexpected weight change.  Respiratory: Negative for cough, chest tightness and shortness of breath.   Cardiovascular: Negative for chest pain, palpitations and leg swelling.  Gastrointestinal: Positive for rectal pain. Negative for abdominal distention, constipation, diarrhea, nausea and vomiting.  Endocrine: Negative for polydipsia, polyphagia and polyuria.  Genitourinary: Negative for decreased urine volume, difficulty urinating and dysuria.  Neurological: Negative for  weakness, light-headedness and headaches.      Objective:    Physical Exam  Constitutional: He is oriented to person, place, and time. He appears well-developed and well-nourished.  HENT:  Head: Normocephalic.  Eyes: Pupils are equal, round, and reactive to light. Conjunctivae and EOM are normal.  Neck: No JVD present. No thyromegaly present.  Cardiovascular: Normal rate, regular rhythm and normal heart sounds.  Pulmonary/Chest: Effort normal and breath sounds normal.  Abdominal: Soft. Bowel sounds are normal. He exhibits no distension. There is no abdominal tenderness.  Musculoskeletal:        General: Normal range of motion.     Cervical back: Normal range of motion and neck supple.  Lymphadenopathy:    He has no cervical adenopathy.  Neurological: He is alert and oriented to person, place, and time. He has normal reflexes.    Skin: Skin is warm and dry.  Psychiatric: He has a normal mood and affect. His behavior is normal. Judgment and thought content normal.  Nursing note and vitals reviewed.   BP 139/86   Pulse 76   Ht 5' 7" (1.702 m)   Wt 172 lb (78 kg)   SpO2 98%   BMI 26.94 kg/m  Wt Readings from Last 3 Encounters:  08/24/19 172 lb (78 kg)  04/26/19 178 lb 0.6 oz (80.8 kg)  03/15/19 172 lb 1.9 oz (78.1 kg)     Health Maintenance Due  Topic Date Due  . HIV Screening  Never done  . URINE MICROALBUMIN  06/29/2019    There are no preventive care reminders to display for this patient.  Lab Results  Component Value Date   TSH 1.22 05/06/2016   Lab Results  Component Value Date   WBC 6.5 06/29/2018   HGB 14.2 06/29/2018   HCT 43.0 06/29/2018   MCV 88.5 06/29/2018   PLT 288 06/29/2018   Lab Results  Component Value Date   NA 137 06/29/2018   K 3.8 06/29/2018   CO2 23 06/29/2018   GLUCOSE 226 (H) 06/29/2018   BUN 14 06/29/2018   CREATININE 0.84 06/29/2018   BILITOT 0.4 06/29/2018   ALKPHOS 85 05/06/2016   AST 36 (H) 06/29/2018   ALT 61 (H)  06/29/2018   PROT 7.4 06/29/2018   ALBUMIN 3.8 05/06/2016   CALCIUM 9.6 06/29/2018   Lab Results  Component Value Date   CHOL 138 06/29/2018   Lab Results  Component Value Date   HDL 37 (L) 06/29/2018   Lab Results  Component Value Date   LDLCALC 68 06/29/2018   Lab Results  Component Value Date   TRIG 253 (H) 06/29/2018   Lab Results  Component Value Date   CHOLHDL 3.7 06/29/2018   Lab Results  Component Value Date   HGBA1C 6.6 (A) 08/24/2019      Assessment & Plan:   1. Uncontrolled type 2 diabetes mellitus with hyperglycemia (HCC) Point-of-care hemoglobin A1c 6.6% today which is down from 6.8% last year in August.   Urine microalbumin was negative today. Plan to continue with Farxiga 10 mg daily.  Encourage patient to check his blood sugar at least 3 times a week or when he is having symptoms.  Also encourage patient to follow low carbohydrate low-fat diet to help improve his glycemic control. Follow-up in 6 months. - POCT glycosylated hemoglobin (Hb A1C) - POCT UA - Microalbumin - dapagliflozin propanediol (FARXIGA) 10 MG TABS tablet; Take 10 mg by mouth daily.  Dispense: 90 tablet; Refill: 1  2. Hemorrhoids, unspecified hemorrhoid type Symptoms and presentation consistent with external hemorrhoids.  This is likely associated with prolonged sitting due to his occupation. Patient has tried and not found much success with typical hemorrhoid treatments therefore we will trial low-dose nitroglycerin ointment twice a day to the rectal tissue as hemorrhoids are present and a lidocaine/hydrocortisone cream to the rectum for burning and itching. Patient instructed not to handle the nitroglycerin cream with his bare hands as this may cause a headache. It is likely that the patient will not find resolution of symptoms without surgical intervention given the prolonged history of symptoms present. Follow-up if symptoms worsen or fail to improve. - Lidocaine-Hydrocortisone Ace  3-0.5 % KIT; Place 1 application rectally 2 (two) times daily as needed (rectal itching).  Dispense: 1 kit; Refill: 4 - Nitroglycerin 0.4 % OINT; Place 1 inch rectally every 12 (  twelve) hours as needed (for hemorrhoids).  Dispense: 30 g; Refill: 3   Return in about 6 months (around 02/23/2020).  Sara E. Early, NP 

## 2019-08-26 ENCOUNTER — Telehealth: Payer: Self-pay | Admitting: Nurse Practitioner

## 2019-08-26 NOTE — Telephone Encounter (Signed)
Received PA for Rectiv sent through cover my meds waiting on determination. - CF

## 2019-08-31 NOTE — Telephone Encounter (Signed)
Received fax from [facility] and they approved Rectiv  Ref Number: [id number]  Valid 08/30/19 - 08/29/20 - CF

## 2019-12-06 DIAGNOSIS — G4733 Obstructive sleep apnea (adult) (pediatric): Secondary | ICD-10-CM | POA: Diagnosis not present

## 2020-01-26 DIAGNOSIS — Z9889 Other specified postprocedural states: Secondary | ICD-10-CM | POA: Diagnosis not present

## 2020-01-26 DIAGNOSIS — I259 Chronic ischemic heart disease, unspecified: Secondary | ICD-10-CM | POA: Diagnosis not present

## 2020-01-27 ENCOUNTER — Encounter: Payer: Self-pay | Admitting: Nurse Practitioner

## 2020-01-27 ENCOUNTER — Other Ambulatory Visit: Payer: Self-pay

## 2020-01-27 ENCOUNTER — Ambulatory Visit (INDEPENDENT_AMBULATORY_CARE_PROVIDER_SITE_OTHER): Payer: BC Managed Care – PPO | Admitting: Nurse Practitioner

## 2020-01-27 VITALS — BP 102/68 | HR 78 | Ht 67.0 in | Wt 171.0 lb

## 2020-01-27 DIAGNOSIS — E1165 Type 2 diabetes mellitus with hyperglycemia: Secondary | ICD-10-CM | POA: Diagnosis not present

## 2020-01-27 DIAGNOSIS — E1159 Type 2 diabetes mellitus with other circulatory complications: Secondary | ICD-10-CM | POA: Diagnosis not present

## 2020-01-27 DIAGNOSIS — R3914 Feeling of incomplete bladder emptying: Secondary | ICD-10-CM | POA: Insufficient documentation

## 2020-01-27 DIAGNOSIS — E785 Hyperlipidemia, unspecified: Secondary | ICD-10-CM

## 2020-01-27 DIAGNOSIS — E1169 Type 2 diabetes mellitus with other specified complication: Secondary | ICD-10-CM

## 2020-01-27 DIAGNOSIS — I152 Hypertension secondary to endocrine disorders: Secondary | ICD-10-CM

## 2020-01-27 DIAGNOSIS — I10 Essential (primary) hypertension: Secondary | ICD-10-CM

## 2020-01-27 DIAGNOSIS — N401 Enlarged prostate with lower urinary tract symptoms: Secondary | ICD-10-CM

## 2020-01-27 MED ORDER — TAMSULOSIN HCL 0.4 MG PO CAPS: 0.4000 mg | ORAL_CAPSULE | Freq: Every day | ORAL | 3 refills | Status: DC

## 2020-01-27 NOTE — Assessment & Plan Note (Signed)
Hypertension appears to be well controlled today with current medication regimen. It looks like it has been over a year since he has had regular labs therefore we will obtain some labs today to evaluate for kidney function and diabetes. Plan to follow-up in approximately 3 months for annual physical exam.

## 2020-01-27 NOTE — Progress Notes (Signed)
Acute Office Visit  Subjective:    Patient ID: Zachary Harvey, male    DOB: May 07, 1960, 59 y.o.   MRN: [id number]  No chief complaint on file.   HPI Patient is in today for increased urinary frequency, nocturia, and urinary hesitancy with incomplete voiding, most notably when he is seated to urinate. He reports that he has had increased urination since starting Farxiga, but more recently he noticed that this is worsening.   He reports that he does not check his blood sugars regularly, but when he does they usually range between 145-165.   He denies pelvic pain, pelvic pressure, flank pain, abdominal pain, unexplained fevers, blood in the urine, difficulty obtaining or maintaining and erection, blood in semen, weight loss, or changes in bowel habits.   Past Medical History:  Diagnosis Date  . Allergy   . BPPV (benign paroxysmal positional vertigo)   . CAD (coronary artery disease)   . Combined nonsenile cataract 01/09/2017  . Dermatochalasis 01/09/2017   Observation per Dr. Larose Kells  . Diabetes mellitus without complication ([place])   . GERD (gastroesophageal reflux disease)   . Hyperlipidemia   . Hypertension   . Myocardial infarction ([place])   . Posterior vitreous detachment of left eye 01/09/2017  . Transaminitis 06/30/2018    Past Surgical History:  Procedure Laterality Date  . CORONARY/GRAFT ACUTE MI REVASCULARIZATION  2012    Family History  Problem Relation Age of Onset  . Diabetes Mother   . Heart disease Mother   . Kidney disease Mother     Social History   Socioeconomic History  . Marital status: Married    Spouse name: Not on file  . Number of children: Not on file  . Years of education: Not on file  . Highest education level: Not on file  Occupational History  . Not on file  Tobacco Use  . Smoking status: Never Smoker  . Smokeless tobacco: Never Used  Substance and Sexual Activity  . Alcohol use: No  . Drug use: No  . Sexual activity: Yes    Birth  control/protection: None  Other Topics Concern  . Not on file  Social History Narrative  . Not on file   Social Determinants of Health   Financial Resource Strain:   . Difficulty of Paying Living Expenses: Not on file  Food Insecurity:   . Worried About Charity fundraiser in the Last Year: Not on file  . Ran Out of Food in the Last Year: Not on file  Transportation Needs:   . Lack of Transportation (Medical): Not on file  . Lack of Transportation (Non-Medical): Not on file  Physical Activity:   . Days of Exercise per Week: Not on file  . Minutes of Exercise per Session: Not on file  Stress:   . Feeling of Stress : Not on file  Social Connections:   . Frequency of Communication with Friends and Family: Not on file  . Frequency of Social Gatherings with Friends and Family: Not on file  . Attends Religious Services: Not on file  . Active Member of Clubs or Organizations: Not on file  . Attends Archivist Meetings: Not on file  . Marital Status: Not on file  Intimate Partner Violence:   . Fear of Current or Ex-Partner: Not on file  . Emotionally Abused: Not on file  . Physically Abused: Not on file  . Sexually Abused: Not on file    Outpatient Medications Prior to Visit  Medication Sig Dispense Refill  . AMBULATORY NON FORMULARY MEDICATION Single glucometer with lancets, test strips 1 each 0  . aspirin 81 MG tablet Take 81 mg by mouth daily.    Marland Kitchen atorvastatin (LIPITOR) 80 MG tablet Take 80 mg by mouth daily.    . betamethasone dipropionate 0.05 % cream Apply topically 2 (two) times daily. To affected area(s) as needed 45 g 1  . blood glucose meter kit and supplies KIT Check morning fasting glucose daily and up to four times daily as needed 1 each 0  . dapagliflozin propanediol (FARXIGA) 10 MG TABS tablet Take 10 mg by mouth daily. 90 tablet 1  . fexofenadine (ALLEGRA) 180 MG tablet Take 180 mg by mouth daily.    . Lidocaine-Hydrocortisone Ace 3-0.5 % KIT Place 1  application rectally 2 (two) times daily as needed (rectal itching). 1 kit 4  . meclizine (ANTIVERT) 25 MG tablet Take 1 tablet (25 mg total) by mouth 3 (three) times daily as needed for dizziness. 30 tablet 1  . metoprolol tartrate (LOPRESSOR) 50 MG tablet Take 50 mg by mouth 2 (two) times daily.    . Nitroglycerin 0.4 % OINT Place 1 inch rectally every 12 (twelve) hours as needed (for hemorrhoids). 30 g 3  . ONETOUCH ULTRA test strip CHECK MORNING FASTING GLUCOSE DAILY AND UP TO 4 TIMES DAILY AS NEEDED 50 each 0  . oxycodone (OXY-IR) 5 MG capsule Take 5 mg by mouth every 4 (four) hours as needed.     No facility-administered medications prior to visit.    No Known Allergies  Review of Systems    See HPI for pertinent positives and negatives. Objective:    Physical Exam Vitals and nursing note reviewed.  Constitutional:      Appearance: Normal appearance.  HENT:     Head: Normocephalic.     Right Ear: Tympanic membrane normal.     Left Ear: Tympanic membrane normal.  Eyes:     Extraocular Movements: Extraocular movements intact.     Conjunctiva/sclera: Conjunctivae normal.     Pupils: Pupils are equal, round, and reactive to light.  Neck:     Vascular: No carotid bruit.  Cardiovascular:     Rate and Rhythm: Normal rate and regular rhythm.     Pulses: Normal pulses.     Heart sounds: Normal heart sounds.  Pulmonary:     Effort: Pulmonary effort is normal.     Breath sounds: Normal breath sounds.  Abdominal:     General: Abdomen is flat. Bowel sounds are normal. There is no distension.     Palpations: Abdomen is soft.     Tenderness: There is no abdominal tenderness. There is no right CVA tenderness, left CVA tenderness, guarding or rebound.  Musculoskeletal:        General: Normal range of motion.     Cervical back: Normal range of motion.     Right lower leg: No edema.     Left lower leg: No edema.  Skin:    General: Skin is warm and dry.     Capillary Refill:  Capillary refill takes less than 2 seconds.  Neurological:     General: No focal deficit present.     Mental Status: He is alert and oriented to person, place, and time.  Psychiatric:        Mood and Affect: Mood normal.        Behavior: Behavior normal.        Thought Content: Thought  content normal.        Judgment: Judgment normal.     BP 102/68   Pulse 78   Ht _0  (1.702 m)   Wt 171 lb (77.6 kg)   BMI 26.78 kg/m  Wt Readings from Last 3 Encounters:  01/27/20 171 lb (77.6 kg)  08/24/19 172 lb (78 kg)  04/26/19 178 lb 0.6 oz (80.8 kg)    Health Maintenance Due  Topic Date Due  . HIV Screening  Never done  . COVID-19 Vaccine (2 - Pfizer 2-dose series) 09/06/2019  . INFLUENZA VACCINE  11/28/2019  . FOOT EXAM  12/04/2019    There are no preventive care reminders to display for this patient.   Lab Results  Component Value Date   TSH 1.22 05/06/2016   Lab Results  Component Value Date   WBC 6.5 06/29/2018   HGB 14.2 06/29/2018   HCT 43.0 06/29/2018   MCV 88.5 06/29/2018   PLT 288 06/29/2018   Lab Results  Component Value Date   NA 137 06/29/2018   K 3.8 06/29/2018   CO2 23 06/29/2018   GLUCOSE 226 (H) 06/29/2018   BUN 14 06/29/2018   CREATININE 0.84 06/29/2018   BILITOT 0.4 06/29/2018   ALKPHOS 85 05/06/2016   AST 36 (H) 06/29/2018   ALT 61 (H) 06/29/2018   PROT 7.4 06/29/2018   ALBUMIN 3.8 05/06/2016   CALCIUM 9.6 06/29/2018   Lab Results  Component Value Date   CHOL 138 06/29/2018   Lab Results  Component Value Date   HDL 37 (L) 06/29/2018   Lab Results  Component Value Date   LDLCALC 68 06/29/2018   Lab Results  Component Value Date   TRIG 253 (H) 06/29/2018   Lab Results  Component Value Date   CHOLHDL 3.7 06/29/2018   Lab Results  Component Value Date   HGBA1C 6.6 (A) 08/24/2019       Assessment & Plan:   Problem List Items Addressed This Visit      Cardiovascular and Mediastinum   Hypertension associated with diabetes  ([place])    Hypertension appears to be well controlled today with current medication regimen. It looks like it has been over a year since he has had regular labs therefore we will obtain some labs today to evaluate for kidney function and diabetes. Plan to follow-up in approximately 3 months for annual physical exam.      Relevant Orders   Hemoglobin A1c   COMPLETE METABOLIC PANEL WITH GFR   Lipid panel   CBC with Differential/Platelet     Endocrine   Hyperlipidemia associated with type 2 diabetes mellitus ([place])    History of hyperlipidemia in the setting of type 2 diabetes.  Psych he has not had labs in well over a year. We will obtain labs today to monitor hyperlipidemia and diabetes. No changes to medications today-we will make changes based on lab results if needed. Plan to follow-up in approximately 3 months for annual physical exam.      Relevant Orders   Hemoglobin A1c   COMPLETE METABOLIC PANEL WITH GFR   Lipid panel   CBC with Differential/Platelet   Uncontrolled type 2 diabetes mellitus with hyperglycemia ([place])    History of type 2 diabetes.  Most recent hemoglobin A1c in April of this year was 6.6. 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 He does not regularly check his blood sugars at home  however reports that most often they are 135-165. Plan to obtain labs today since he is here and will monitor hemoglobin A1c along with CMP and CBC and lipids. No changes to medications today-we will make changes as needed based on lab results. Plan to follow-up in 3 months for annual physical exam.      Relevant Orders   Hemoglobin A1c   COMPLETE METABOLIC PANEL WITH GFR   Lipid panel   CBC with Differential/Platelet     Genitourinary   Benign prostatic hyperplasia with incomplete bladder emptying - Primary    Symptoms and presentation consistent with LUTS and BPH.  He is unable to provide a urine sample today due to  using the restroom immediately prior to coming into the office.  He does not have any symptoms consistent with urinary tract infection and denies a history of UTI or STDs therefore we will defer this today.  It does not appear that the patient has had a PSA measured recently or a baseline obtained. We will plan to obtain a PSA today. I will prophylactically begin on Flomax 0.4 mg daily.  Recommend the patient take this prior to going to bed.  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 We will make changes to plan of care based on lab results if needed. If symptoms persist despite the treatment with Flomax we will need to obtain a urine sample for evaluation. Follow-up in approximately 3 months for annual physical exam.      Relevant Medications   tamsulosin (FLOMAX) 0.4 MG CAPS capsule   Other Relevant Orders   PSA       Meds ordered this encounter  Medications  . tamsulosin (FLOMAX) 0.4 MG CAPS capsule    Sig: Take 1 capsule (0.4 mg total) by mouth daily.    Dispense:  30 capsule    Refill:  3   Patient will be due for annual physical exam in late December/[place] January.  Please plan to follow-up at that time.'s labs obtained today and therefore will not necessarily need repeating unless changes have been made to medication.  Orma Render, NP

## 2020-01-27 NOTE — Assessment & Plan Note (Signed)
History of hyperlipidemia in the setting of type 2 diabetes.  Psych he has not had labs in well over a year. We will obtain labs today to monitor hyperlipidemia and diabetes. No changes to medications today-we will make changes based on lab results if needed. Plan to follow-up in approximately 3 months for annual physical exam.

## 2020-01-27 NOTE — Assessment & Plan Note (Addendum)
Symptoms and presentation consistent with LUTS and BPH. Discussed with the patient that Marcelline Deist can most definitely cause increased urination however it should not cause the symptoms of incomplete emptying and urinary hesitancy that he is experiencing-especially is a new symptom after being on the [place] for over a year. He is unable to provide a urine sample today due to using the restroom immediately prior to coming into the office.  He does not have any symptoms consistent with urinary tract infection and denies a history of UTI or STDs therefore we will defer this today.  It does not appear that the patient has had a PSA measured recently or a baseline obtained. We will plan to obtain a PSA today. I will prophylactically begin on Flomax 0.4 mg daily.  Recommend the patient take this prior to going to bed.  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 We will make changes to plan of care based on lab results if needed. If symptoms persist despite the treatment with Flomax we will need to obtain a urine sample for evaluation. Follow-up in approximately 3 months for annual physical exam.

## 2020-01-27 NOTE — Patient Instructions (Addendum)
We will check your prostate lab today to see if this could be causing the symptoms you are experiencing. The information below tells about this test.   Your symptoms are consistent with benign prostatic hypertrophy (BPH), which is inflammation of the prostate. I can give you a medication called Flomax that will help with the symptoms.   I would like you to follow up at the end of December for your annual physical exam. We will go ahead and get labs today to make sure everything looks ok.   Prostate-Specific Antigen Test Why am I having this test? The prostate-specific antigen (PSA) test is a screening test for prostate cancer. It can identify Florentino Laabs signs of prostate cancer, which may allow for more effective treatment. Your health care provider may recommend that you have a PSA test starting at age 57 or that you have one earlier or later, depending on your risk factors for prostate cancer. You may also have a PSA test:  To monitor treatment of prostate cancer.  To check whether prostate cancer has returned after treatment.  If you have signs of other conditions that can affect PSA levels, such as: ? An enlarged prostate that is not caused by cancer (benign prostatic hyperplasia, BPH). This condition is very common in older men. ? A prostate infection. What is being tested? This test measures the amount of PSA in your blood. PSA is a protein that is made in the prostate. The prostate naturally produces more PSA as you age, but very high levels may be a sign of a medical condition. What kind of sample is taken?  A blood sample is required for this test. It is usually collected by inserting a needle into a blood vessel or by sticking a finger with a small needle. Blood for this test should be drawn before having an exam of the prostate. How do I prepare for this test? Do not ejaculate starting 24 hours before your test, or as long as told by your health care provider. Tell a health care  provider about:  Any allergies you have.  All medicines you are taking, including vitamins, herbs, eye drops, creams, and over-the-counter medicines. This also includes: ? Medicines to assist with hair growth, such as finasteride. ? Any recent exposure to a medicine called diethylstilbestrol.  Any blood disorders you have.  Any recent procedures you have had, especially any procedures involving the prostate or rectum.  Any medical conditions you have.  Any recent urinary tract infections (UTIs) you have had. How are the results reported? Your test results will be reported as a value that indicates how much PSA is in your blood. This will be given as nanograms of PSA per milliliter of blood (ng/mL). Your health care provider will compare your results to normal ranges that were established after testing a large group of people (reference ranges). Reference ranges may vary among labs and hospitals. PSA levels vary from person to person and generally increase with age. Because of this variation, there is no single PSA value that is considered normal for everyone. Instead, PSA reference ranges are used to describe whether your PSA levels are considered low or high (elevated). Common reference ranges are:  Low: 0-2.5 ng/mL.  Slightly to moderately elevated: 2.6-10.0 ng/mL.  Moderately elevated: 10.0-19.9 ng/mL.  Significantly elevated: 20 ng/mL or greater. Sometimes, the test results may report that a condition is present when it is not present (false-positive result). What do the results mean? A test result that is  higher than 4 ng/mL may mean that you are at an increased risk for prostate cancer. However, a PSA test by itself is not enough to diagnose prostate cancer. High PSA levels may also be caused by the natural aging process, prostate infection, or BPH. PSA screening cannot tell you if your PSA is high due to cancer or a different cause. A prostate biopsy is the only way to diagnose  prostate cancer. A risk of having the PSA test is diagnosing and treating prostate cancer that would never have caused any symptoms or problems (overdiagnosis and overtreatment). Talk with your health care provider about what your results mean. Questions to ask your health care provider Ask your health care provider, or the department that is doing the test:  When will my results be ready?  How will I get my results?  What are my treatment options?  What other tests do I need?  What are my next steps? Summary  The prostate-specific antigen (PSA) test is a screening test for prostate cancer.  Your health care provider may recommend that you have a PSA test starting at age 31 or that you have one earlier or later, depending on your risk factors for prostate cancer.  A test result that is higher than 4 ng/mL may mean that you are at an increased risk for prostate cancer. However, elevated levels can be caused by a number of conditions other than prostate cancer.  Talk with your health care provider about what your results mean. This information is not intended to replace advice given to you by your health care provider. Make sure you discuss any questions you have with your health care provider. Document Revised: 03/28/2017 Document Reviewed: 01/20/2017 Elsevier Patient Education  2020 [employer]    Benign Prostatic Hyperplasia  Benign prostatic hyperplasia (BPH) is an enlarged prostate gland that is caused by the normal aging process and not by cancer. The prostate is a walnut-sized gland that is involved in the production of semen. It is located in front of the rectum and below the bladder. The bladder stores urine and the urethra is the tube that carries the urine out of the body. The prostate may get bigger as a man gets older. An enlarged prostate can press on the urethra. This can make it harder to pass urine. The build-up of urine in the bladder can cause infection. Back  pressure and infection may progress to bladder damage and kidney (renal) failure. What are the causes? This condition is part of a normal aging process. However, not all men develop problems from this condition. If the prostate enlarges away from the urethra, urine flow will not be blocked. If it enlarges toward the urethra and compresses it, there will be problems passing urine. What increases the risk? This condition is more likely to develop in men over the age of 50 years. What are the signs or symptoms? Symptoms of this condition include:  Getting up often during the night to urinate.  Needing to urinate frequently during the day.  Difficulty starting urine flow.  Decrease in size and strength of your urine stream.  Leaking (dribbling) after urinating.  Inability to pass urine. This needs immediate treatment.  Inability to completely empty your bladder.  Pain when you pass urine. This is more common if there is also an infection.  Urinary tract infection (UTI). How is this diagnosed? This condition is diagnosed based on your medical history, a physical exam, and your symptoms. Tests will also  be done, such as:  A post-void bladder scan. This measures any amount of urine that may remain in your bladder after you finish urinating.  A digital rectal exam. In a rectal exam, your health care provider checks your prostate by putting a lubricated, gloved finger into your rectum to feel the back of your prostate gland. This exam detects the size of your gland and any abnormal lumps or growths.  An exam of your urine (urinalysis).  A prostate specific antigen (PSA) screening. This is a blood test used to screen for prostate cancer.  An ultrasound. This test uses sound waves to electronically produce a picture of your prostate gland. Your health care provider may refer you to a specialist in kidney and prostate diseases (urologist). How is this treated? Once symptoms begin, your  health care provider will monitor your condition (active surveillance or watchful waiting). Treatment for this condition will depend on the severity of your condition. Treatment may include:  Observation and yearly exams. This may be the only treatment needed if your condition and symptoms are mild.  Medicines to relieve your symptoms, including: ? Medicines to shrink the prostate. ? Medicines to relax the muscle of the prostate.  Surgery in severe cases. Surgery may include: ? Prostatectomy. In this procedure, the prostate tissue is removed completely through an open incision or with a laparoscope or robotics. ? Transurethral resection of the prostate (TURP). In this procedure, a tool is inserted through the opening at the tip of the penis (urethra). It is used to cut away tissue of the inner core of the prostate. The pieces are removed through the same opening of the penis. This removes the blockage. ? Transurethral incision (TUIP). In this procedure, small cuts are made in the prostate. This lessens the prostate's pressure on the urethra. ? Transurethral microwave thermotherapy (TUMT). This procedure uses microwaves to create heat. The heat destroys and removes a small amount of prostate tissue. ? Transurethral needle ablation (TUNA). This procedure uses radio frequencies to destroy and remove a small amount of prostate tissue. ? Interstitial laser coagulation (ILC). This procedure uses a laser to destroy and remove a small amount of prostate tissue. ? Transurethral electrovaporization (TUVP). This procedure uses electrodes to destroy and remove a small amount of prostate tissue. ? Prostatic urethral lift. This procedure inserts an implant to push the lobes of the prostate away from the urethra. Follow these instructions at home:  Take over-the-counter and prescription medicines only as told by your health care provider.  Monitor your symptoms for any changes. Contact your health care  provider with any changes.  Avoid drinking large amounts of liquid before going to bed or out in public.  Avoid or reduce how much caffeine or alcohol you drink.  Give yourself time when you urinate.  Keep all follow-up visits as told by your health care provider. This is important. Contact a health care provider if:  You have unexplained back pain.  Your symptoms do not get better with treatment.  You develop side effects from the medicine you are taking.  Your urine becomes very dark or has a bad smell.  Your lower abdomen becomes distended and you have trouble passing your urine. Get help right away if:  You have a fever or chills.  You suddenly cannot urinate.  You feel lightheaded, or very dizzy, or you faint.  There are large amounts of blood or clots in the urine.  Your urinary problems become hard to manage.  You  develop moderate to severe low back or flank pain. The flank is the side of your body between the ribs and the hip. These symptoms may represent a serious problem that is an emergency. Do not wait to see if the symptoms will go away. Get medical help right away. Call your local emergency services (911 in the [place] Do not drive yourself to the hospital. Summary  Benign prostatic hyperplasia (BPH) is an enlarged prostate that is caused by the normal aging process and not by cancer.  An enlarged prostate can press on the urethra. This can make it hard to pass urine.  This condition is part of a normal aging process and is more likely to develop in men over the age of 50 years.  Get help right away if you suddenly cannot urinate. This information is not intended to replace advice given to you by your health care provider. Make sure you discuss any questions you have with your health care provider. Document Revised: 03/10/2018 Document Reviewed: 05/20/2016 Elsevier Patient Education  2020 [employer].

## 2020-01-27 NOTE — Assessment & Plan Note (Signed)
History of type 2 diabetes.  Most recent hemoglobin A1c in April of this year was 6.6. 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 He does not regularly check his blood sugars at home however reports that most often they are 135-165. Plan to obtain labs today since he is here and will monitor hemoglobin A1c along with CMP and CBC and lipids. No changes to medications today-we will make changes as needed based on lab results. Plan to follow-up in 3 months for annual physical exam.

## 2020-01-28 DIAGNOSIS — E1169 Type 2 diabetes mellitus with other specified complication: Secondary | ICD-10-CM | POA: Diagnosis not present

## 2020-01-28 DIAGNOSIS — E785 Hyperlipidemia, unspecified: Secondary | ICD-10-CM | POA: Diagnosis not present

## 2020-01-28 DIAGNOSIS — E1159 Type 2 diabetes mellitus with other circulatory complications: Secondary | ICD-10-CM | POA: Diagnosis not present

## 2020-01-28 DIAGNOSIS — R3914 Feeling of incomplete bladder emptying: Secondary | ICD-10-CM | POA: Diagnosis not present

## 2020-01-28 DIAGNOSIS — I10 Essential (primary) hypertension: Secondary | ICD-10-CM | POA: Diagnosis not present

## 2020-01-29 LAB — CBC WITH DIFFERENTIAL/PLATELET
Absolute Monocytes: 738 cells/uL (ref 200–950)
Basophils Absolute: 28 cells/uL (ref 0–200)
Basophils Relative: 0.4 %
Eosinophils Absolute: 131 cells/uL (ref 15–500)
Eosinophils Relative: 1.9 %
HCT: 49.4 % (ref 38.5–50.0)
Hemoglobin: 16.7 g/dL (ref 13.2–17.1)
Lymphs Abs: 2256 cells/uL (ref 850–3900)
MCH: 30.3 pg (ref 27.0–33.0)
MCHC: 33.8 g/dL (ref 32.0–36.0)
MCV: 89.7 fL (ref 80.0–100.0)
MPV: 11.2 fL (ref 7.5–12.5)
Monocytes Relative: 10.7 %
Neutro Abs: 3747 cells/uL (ref 1500–7800)
Neutrophils Relative %: 54.3 %
Platelets: 245 10*3/uL (ref 140–400)
RBC: 5.51 10*6/uL (ref 4.20–5.80)
RDW: 13.1 % (ref 11.0–15.0)
Total Lymphocyte: 32.7 %
WBC: 6.9 10*3/uL (ref 3.8–10.8)

## 2020-01-29 LAB — HEMOGLOBIN A1C
Hgb A1c MFr Bld: 6.7 % of total Hgb — ABNORMAL HIGH (ref <5.7)
Mean Plasma Glucose: 146 (calc)
eAG (mmol/L): 8.1 (calc)

## 2020-01-29 LAB — COMPLETE METABOLIC PANEL WITH GFR
AG Ratio: 1.3 (calc) (ref 1.0–2.5)
ALT: 36 U/L (ref 9–46)
AST: 24 U/L (ref 10–35)
Albumin: 4.3 g/dL (ref 3.6–5.1)
Alkaline phosphatase (APISO): 108 U/L (ref 35–144)
BUN: 20 mg/dL (ref 7–25)
CO2: 26 mmol/L (ref 20–32)
Calcium: 9.5 mg/dL (ref 8.6–10.3)
Chloride: 103 mmol/L (ref 98–110)
Creat: 0.89 mg/dL (ref 0.70–1.33)
GFR, Est African American: 110 mL/min/{1.73_m2} (ref >=60)
GFR, Est Non African American: 95 mL/min/{1.73_m2} (ref >=60)
Globulin: 3.2 g/dL (calc) (ref 1.9–3.7)
Glucose, Bld: 134 mg/dL — ABNORMAL HIGH (ref 65–99)
Potassium: 4.3 mmol/L (ref 3.5–5.3)
Sodium: 138 mmol/L (ref 135–146)
Total Bilirubin: 0.4 mg/dL (ref 0.2–1.2)
Total Protein: 7.5 g/dL (ref 6.1–8.1)

## 2020-01-29 LAB — LIPID PANEL
Cholesterol: 154 mg/dL (ref <200)
HDL: 40 mg/dL (ref >=40)
LDL Cholesterol (Calc): 77 mg/dL (calc)
Non-HDL Cholesterol (Calc): 114 mg/dL (calc) (ref <130)
Total CHOL/HDL Ratio: 3.9 (calc) (ref <5.0)
Triglycerides: 310 mg/dL — ABNORMAL HIGH (ref <150)

## 2020-01-29 LAB — PSA: PSA: 0.28 ng/mL (ref <=4.0)

## 2020-01-31 NOTE — Progress Notes (Signed)
Zachary Harvey,   Your labs have come back. It looks like your PSA (prostate) level is good. Some of your urinary symptoms could be made worse with the diabetes medication, but if your symptoms are not helped with the Flomax. Please let me know if this does not improve.  Your hemoglobin A1c bumped just a bit, but still under 7. Try to reduce your carbohydrates and watch your consumption of sugar in things like drinks and alcohol.  Your cholesterol was good overall, but your triglycerides are elevated. This could be due to elevated blood sugar levels as well as diet. Try to keep your diet low in saturated fats and triglycerides and keep watching your carbohydrates and hopefully this will come down.   Let me know if you have any questions.  [name]

## 2020-02-14 ENCOUNTER — Ambulatory Visit (INDEPENDENT_AMBULATORY_CARE_PROVIDER_SITE_OTHER): Payer: BC Managed Care – PPO | Admitting: Sports Medicine

## 2020-02-14 ENCOUNTER — Encounter: Payer: Self-pay | Admitting: Sports Medicine

## 2020-02-14 DIAGNOSIS — H814 Vertigo of central origin: Secondary | ICD-10-CM

## 2020-02-14 DIAGNOSIS — R42 Dizziness and giddiness: Secondary | ICD-10-CM

## 2020-02-14 MED ORDER — DIAZEPAM 5 MG PO TABS: 5.0000 mg | ORAL_TABLET | Freq: Three times a day (TID) | ORAL | 0 refills | Status: DC | PRN

## 2020-02-14 MED ORDER — PREDNISONE 50 MG PO TABS: 50.0000 mg | ORAL_TABLET | Freq: Every day | ORAL | 0 refills | Status: DC

## 2020-02-14 NOTE — Progress Notes (Signed)
    Procedures performed today:    None.  Independent interpretation of notes and tests performed by another provider:   None.  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 On further questioning he does get episodes of visual blurriness followed by headaches and the vertigo potentially indicating a vestibular migrainous cause. Looking back in his chart he has a diagnosis of sensorineural hearing loss with [facility], considering to symptoms related to cranial nerve VIII I would like to proceed with brain MRI with and without contrast with thin slices through the internal acoustic meatus. I am also going to add prednisone, vestibular rehabilitation, as well as Valium. Out of work for 1 to 2 weeks, return to see me in 2 weeks.    ___________________________________________ [name]. [name], M.D., ABFM., CAQSM. Primary Care and Sports Medicine [facility]  Adjunct Instructor of Family Medicine  [employer]

## 2020-02-14 NOTE — Assessment & Plan Note (Signed)
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 On further questioning he does get episodes of visual blurriness followed by headaches and the vertigo potentially indicating a vestibular migrainous cause. Looking back in his chart he has a diagnosis of sensorineural hearing loss with [facility], considering to symptoms related to cranial nerve VIII I would like to proceed with brain MRI with and without contrast with thin slices through the internal acoustic meatus. I am also going to add prednisone, vestibular rehabilitation, as well as Valium. Out of work for 1 to 2 weeks, return to see me in 2 weeks.

## 2020-02-18 ENCOUNTER — Ambulatory Visit (INDEPENDENT_AMBULATORY_CARE_PROVIDER_SITE_OTHER): Payer: BC Managed Care – PPO | Admitting: Rehabilitative and Restorative Service Providers"

## 2020-02-18 ENCOUNTER — Other Ambulatory Visit: Payer: Self-pay

## 2020-02-18 DIAGNOSIS — H8111 Benign paroxysmal vertigo, right ear: Secondary | ICD-10-CM

## 2020-02-18 DIAGNOSIS — R42 Dizziness and giddiness: Secondary | ICD-10-CM | POA: Diagnosis not present

## 2020-02-18 NOTE — Therapy (Signed)
[facility] [address] [place], [place], [address] Phone: [phone]   Fax:  [phone]  Physical Therapy Evaluation  Patient Details  Name: Zachary Harvey MRN: [id number] Date of Birth: 1960-09-12 Referring Provider (PT): Rodney Langton, MD   Encounter Date: 02/18/2020   PT End of Session - 02/18/20 1450    Visit Number 1    Number of Visits 8    Date for PT Re-Evaluation 03/19/20    Authorization Type BCBS    PT Start Time 1400    PT Stop Time 1445    PT Time Calculation (min) 45 min    Activity Tolerance Other (comment)   nausea   Behavior During Therapy [facility] for tasks assessed/performed           Past Medical History:  Diagnosis Date  . Allergy   . BPPV (benign paroxysmal positional vertigo)   . CAD (coronary artery disease)   . Combined nonsenile cataract 01/09/2017  . Dermatochalasis 01/09/2017   Observation per Dr. Hardie Shackleton  . Diabetes mellitus without complication (HCC)   . GERD (gastroesophageal reflux disease)   . Hyperlipidemia   . Hypertension   . Myocardial infarction (HCC)   . Posterior vitreous detachment of left eye 01/09/2017  . Transaminitis 06/30/2018    Past Surgical History:  Procedure Laterality Date  . CORONARY/GRAFT ACUTE MI REVASCULARIZATION  2012    There were no vitals filed for this visit.    Subjective Assessment - 02/18/20 1405    Subjective The patient reports a h/o episodic vertigo.  He awoke last week with spinning sensations.  Dizziness is worse with positional changes in bed.  He wakes up when rolling in bed.    Pertinent History h/o MI, h/o vertigo. h/o migraines >16 years ago controlled with OTC meds.    Patient Stated Goals reduce sensation of spinning    Currently in Pain? No/denies              [facility] PT Assessment - 02/18/20 1408      Assessment   Medical Diagnosis vertigo    Referring Provider (PT) Rodney Langton, MD    Onset Date/Surgical  Date 02/14/20    Prior Therapy saw PT in 2017 at another PT location      Balance Screen   Has the patient fallen in the past 6 months No    Has the patient had a decrease in activity level because of a fear of falling?  No    Is the patient reluctant to leave their home because of a fear of falling?  No      Home Environment   Living Environment Private residence    Living Arrangements Spouse/significant other;Children      Prior Function   Level of Independence Independent    Vocation Full time employment    Vocation Requirements truck driver      Observation/Other Assessments   Focus on Therapeutic Outcomes (FOTO)  60% limited                  Vestibular Assessment - 02/18/20 1410      Vestibular Assessment   General Observation No dizziness at rest; gets nausea with positional movements.      Symptom Behavior   Subjective history of current problem began last week    Type of Dizziness  Spinning    Frequency of Dizziness daily    Duration of Dizziness seconds to minutes    Symptom [employer]  provoked;Positional    Aggravating Factors Rolling to right;Lying supine    Relieving Factors Head stationary    Progression of Symptoms Better    History of similar episodes h/o episodic vertigo      Oculomotor Exam   Oculomotor Alignment Normal    Ocular ROM WFLs    Spontaneous Absent    Gaze-induced  Absent    Smooth Pursuits Intact    Saccades Intact      Vestibulo-Ocular Reflex   VOR 1 Head Only (x 1 viewing) x 1 viewing=reports blurred vision and "out of focus" with 3-4/10 dizziness    Comment head impulse test= positive for mild refixation saccade to target to the R side      Positional Testing   Dix-Hallpike Dix-Hallpike Right;Dix-Hallpike Left    Horizontal Canal Testing Horizontal Canal Right;Horizontal Canal Left      Dix-Hallpike Right   Dix-Hallpike Right Duration x 20 seconds    Dix-Hallpike Right Symptoms Upbeat, right rotatory nystagmus       Dix-Hallpike Left   Dix-Hallpike Left Duration none    Dix-Hallpike Left Symptoms No nystagmus      Horizontal Canal Right   Horizontal Canal Right Duration none    Horizontal Canal Right Symptoms Normal      Horizontal Canal Left   Horizontal Canal Left Duration none    Horizontal Canal Left Symptoms Normal              Objective measurements completed on examination: See above findings.        Vestibular Treatment/Exercise - 02/18/20 1432      Vestibular Treatment/Exercise   Vestibular Treatment Provided Canalith Repositioning    Canalith Repositioning Epley Manuever Right;Canal Roll Right       EPLEY MANUEVER RIGHT   Number of Reps  1    Overall Response Symptoms Worsened    Response Details  nystagmus changed direction to horizontal geotropic direction *patient's otoconia repositioned into horizontal canal      Canal Roll Right   Number of Reps  2    Response Details  Patient continued with nystagmus and dizziness + nausea with geotropic nystagmus observed on second repetition.                 PT Education - 02/18/20 1432    Education Details nature of BPPV    Person(s) Educated Patient    Methods Explanation    Comprehension Verbalized understanding               PT Long Term Goals - 02/18/20 1450      PT LONG TERM GOAL #1   Title The patient will be indep with self mgmt of BPPV    Time 4    Period Weeks    Target Date 03/19/20      PT LONG TERM GOAL #2   Title The patient will have negative positional testing.    Time 4    Period Weeks    Target Date 03/19/20      PT LONG TERM GOAL #3   Title The patient will reduce functional limitation from vertigo from 60% to < or equal to 26%.    Time 4    Period Weeks    Target Date 03/19/20                  Plan - 02/18/20 1656    Clinical Impression Statement The patient is a 59 yo male with h/o episodic vertigo with new onset  last week.  He presents today with R posterior  canalithiasis BPPV, and dec'd VOR.  PT initiiated tx of BPPV and the patient's otoconia repositioned into horizontal canal per change in nystagmus direction.  PT treated with horizontal CRT x 2 reps.  Plan to reassess next visit and treat as indicated.    Personal Factors and Comorbidities Comorbidity 1;Comorbidity 2    Comorbidities h/o MI, h/o recurrent vertigo    Examination-Activity Limitations Bed Mobility;Reach Overhead    Examination-Participation Restrictions Occupation;Community Activity;Driving    Stability/Clinical Decision Making Stable/Uncomplicated    Clinical Decision Making Low    Rehab Potential Good    PT Frequency 2x / week    PT Duration 4 weeks    PT Treatment/Interventions Vestibular;Canalith Repostioning;Neuromuscular re-education;Patient/family education;ADLs/Self Care Home Management;Gait training;Manual techniques    PT Next Visit Plan check BPPV and treat, VOR training, HEP for habituation if indicated; provide self treatment of BPPV since recurring    Consulted and Agree with Plan of Care Patient           Patient will benefit from skilled therapeutic intervention in order to improve the following deficits and impairments:  Dizziness, Decreased balance, Decreased activity tolerance  Visit Diagnosis: BPPV (benign paroxysmal positional vertigo), right  Dizziness and giddiness     Problem List Patient Active Problem List   Diagnosis Date Noted  . Benign prostatic hyperplasia with incomplete bladder emptying 01/27/2020  . S/P CABG (coronary artery bypass graft) 02/17/2019  . DDD (degenerative disc disease), cervical 01/29/2019  . Intractable migraine without aura and without status migrainosus 12/25/2018  . History of 2019 novel coronavirus disease (COVID-19) 12/04/2018  . Tinea pedis of both feet 12/04/2018  . Hypertension associated with diabetes (HCC) 12/04/2018  . New daily persistent headache 12/04/2018  . Controlled type 2 diabetes mellitus with  diabetic dermatitis, without long-term current use of insulin (HCC) 12/04/2018  . Pruritus ani 11/23/2018  . Cough headache syndrome 11/23/2018  . Respiratory tract infection due to COVID-19 virus 11/23/2018  . Transaminitis 06/30/2018  . Diabetic polyneuropathy associated with type 2 diabetes mellitus (HCC) 06/29/2018  . Colon cancer screening 10/03/2017  . Hyperlipidemia associated with type 2 diabetes mellitus (HCC) 10/03/2017  . Uncontrolled type 2 diabetes mellitus with hyperglycemia (HCC) 10/03/2017  . Combined nonsenile cataract 01/09/2017  . Dermatochalasis 01/09/2017  . Posterior vitreous detachment of left eye 01/09/2017  . 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 Phimosis 09/20/2016  . Vitamin D insufficiency 05/07/2016  . Hypertriglyceridemia 05/07/2016  . Overweight (BMI 25.0-29.9) 05/06/2016  . Hematuria 11/27/2015  . Hyperglycemia 11/20/2015  . Ischemic cardiomyopathy 11/20/2015  . Exertional angina (HCC) 11/01/2015  . GERD (gastroesophageal reflux disease) 07/10/2015  . SNHL (sensory-neural hearing loss), asymmetrical 06/26/2015  . Vertigo 06/12/2015  . History of MI (myocardial infarction) 05/26/2015  . Hyperlipidemia 05/26/2015  . Elevated liver enzymes 05/26/2015  . Allergic rhinitis 08/17/2012    Chablis Losh, PT 02/18/2020, 4:59 PM  [facility] [address] [place], [place], [address] Phone: [phone]   Fax:  [phone]  Name: Zachary Harvey MRN: [id number] Date of Birth: 1960-05-24

## 2020-02-21 ENCOUNTER — Ambulatory Visit (INDEPENDENT_AMBULATORY_CARE_PROVIDER_SITE_OTHER): Payer: BC Managed Care – PPO

## 2020-02-21 ENCOUNTER — Other Ambulatory Visit: Payer: Self-pay

## 2020-02-21 DIAGNOSIS — H8110 Benign paroxysmal vertigo, unspecified ear: Secondary | ICD-10-CM | POA: Diagnosis not present

## 2020-02-21 DIAGNOSIS — R9082 White matter disease, unspecified: Secondary | ICD-10-CM | POA: Diagnosis not present

## 2020-02-21 DIAGNOSIS — R42 Dizziness and giddiness: Secondary | ICD-10-CM | POA: Diagnosis not present

## 2020-02-21 DIAGNOSIS — H905 Unspecified sensorineural hearing loss: Secondary | ICD-10-CM | POA: Diagnosis not present

## 2020-02-21 MED ORDER — GADOBUTROL 1 MMOL/ML IV SOLN
7.5000 mL | Freq: Once | INTRAVENOUS | Status: AC | PRN
  Administered 2020-02-21: 7.5 mL via INTRAVENOUS

## 2020-02-24 ENCOUNTER — Ambulatory Visit: Payer: BC Managed Care – PPO | Admitting: Nurse Practitioner

## 2020-02-25 ENCOUNTER — Ambulatory Visit (INDEPENDENT_AMBULATORY_CARE_PROVIDER_SITE_OTHER): Payer: BC Managed Care – PPO | Admitting: Rehabilitative and Restorative Service Providers"

## 2020-02-25 ENCOUNTER — Other Ambulatory Visit: Payer: Self-pay

## 2020-02-25 ENCOUNTER — Ambulatory Visit: Payer: BC Managed Care – PPO | Admitting: Rehabilitative and Restorative Service Providers"

## 2020-02-25 DIAGNOSIS — H8111 Benign paroxysmal vertigo, right ear: Secondary | ICD-10-CM | POA: Diagnosis not present

## 2020-02-25 DIAGNOSIS — R42 Dizziness and giddiness: Secondary | ICD-10-CM | POA: Diagnosis not present

## 2020-02-25 NOTE — Patient Instructions (Signed)
Access Code: [email or web address] URL: [email or web address] Date: 02/25/2020 Prepared by: Margretta Ditty  Exercises Supine to Right Side Lying Vestibular Habituation - 2 x daily - 7 x weekly - 1 sets - 5 reps Seated Gentle Upper Trapezius Stretch - 2 x daily - 7 x weekly - 1 sets - 3 reps - 20 seconds hold Standing Balance with Eyes Closed on Foam - 2 x daily - 7 x weekly - 1 sets - 10 reps

## 2020-02-25 NOTE — Therapy (Addendum)
[facility] [place] [address] [place], [place], [address] Phone: [phone]   Fax:  [phone]  Physical Therapy Treatment and Discharge Summary  Patient Details  Name: Zachary Harvey MRN: [id number] Date of Birth: 15-Jan-1961 Referring Provider (PT): Rodney Langton, MD   Encounter Date: 02/25/2020   PT End of Session - 02/25/20 1416    Visit Number 2    Number of Visits 8    Date for PT Re-Evaluation 03/19/20    Authorization Type BCBS    PT Start Time 1401    PT Stop Time 1445    PT Time Calculation (min) 44 min    Behavior During Therapy [facility] for tasks assessed/performed           Past Medical History:  Diagnosis Date  . Allergy   . BPPV (benign paroxysmal positional vertigo)   . CAD (coronary artery disease)   . Combined nonsenile cataract 01/09/2017  . Dermatochalasis 01/09/2017   Observation per Dr. Hardie Shackleton  . Diabetes mellitus without complication (HCC)   . GERD (gastroesophageal reflux disease)   . Hyperlipidemia   . Hypertension   . Myocardial infarction (HCC)   . Posterior vitreous detachment of left eye 01/09/2017  . Transaminitis 06/30/2018    Past Surgical History:  Procedure Laterality Date  . CORONARY/GRAFT ACUTE MI REVASCULARIZATION  2012    There were no vitals filed for this visit.   Subjective Assessment - 02/25/20 1403    Subjective The patient reports he is not longer spinning, but still has dizziness.  He reports ocntinued light sensitivity, sensation of dizziness with head movement.  He has daily HA that never go away with frontal head pressure.    Pertinent History h/o MI, h/o vertigo. h/o migraines >16 years ago controlled with OTC meds.    Patient Stated Goals reduce sensation of spinning    Currently in Pain? Yes    Pain Score 5     Pain Location Head    Pain Descriptors / Indicators Headache    Pain Type Chronic pain    Pain Radiating Towards headache is frontal and pain  radiates into bilat upper trap    Aggravating Factors  always there    Pain Relieving Factors some response to OTC medicine                   Vestibular Assessment - 02/25/20 1407      Vestibular Assessment   General Observation Gets dizzy sensation when he closes his eyes.      Positional Testing   Dix-Hallpike Dix-Hallpike Right    Sidelying Test Sidelying Right;Sidelying Left    Horizontal Canal Testing Horizontal Canal Right;Horizontal Canal Left      Dix-Hallpike Right   Dix-Hallpike Right Duration negative-- but experiences sensation of falling    Dix-Hallpike Right Symptoms No nystagmus      Sidelying Right   Sidelying Right Duration none    Sidelying Right Symptoms No nystagmus      Sidelying Left   Sidelying Left Duration trace report    Sidelying Left Symptoms No nystagmus      Horizontal Canal Right   Horizontal Canal Right Duration none    Horizontal Canal Right Symptoms Normal      Horizontal Canal Left   Horizontal Canal Left Duration 10 second duration viewed using principles of alexander's law     Horizontal Canal Left Symptoms Ageotrophic  [facility] Adult PT Treatment/Exercise - 02/25/20 1442      Neuro Re-ed    Neuro Re-ed Details  Compliant surface standing on foam with eyes closed -- provided for HEP due to sway      Exercises   Exercises Neck      Neck Exercises: Seated   Lateral Flexion Right;Left   for upper trap stretching          Vestibular Treatment/Exercise - 02/25/20 1412      Vestibular Treatment/Exercise   Vestibular Treatment Provided Canalith Repositioning;Gaze;Habituation    Canalith Repositioning Canal Roll Right    Habituation Exercises Horizontal Roll      Canal Roll Right   Number of Reps  3    Overall Response  Improved Symptoms    Response Details  treated with Kim maneuver (2011 article) for Horizontal cupulolithiasis BPPV on R side noted by increased L apogeotropic nystagmus viewed  in room light; then noted mild continued symtpoms with direction change to geotropic so HCT for canalithiasis performed; then performed 2016 maneuver (Zuma) to ensure cupulolithiasis resolved.  Patient continues with minimal symptoms at end of session.      Horizontal Roll   Number of Reps  5    Symptom Description  patient initially got dizziness with first 2 reps, decreases with repetition; also notes some nausea on first rep                 PT Education - 02/25/20 1501    Education Details HEP for habituation    Person(s) Educated Patient    Methods Explanation;Demonstration;Handout    Comprehension Verbalized understanding;Returned demonstration               PT Long Term Goals - 02/18/20 1450      PT LONG TERM GOAL #1   Title The patient will be indep with self mgmt of BPPV    Time 4    Period Weeks    Target Date 03/19/20      PT LONG TERM GOAL #2   Title The patient will have negative positional testing.    Time 4    Period Weeks    Target Date 03/19/20      PT LONG TERM GOAL #3   Title The patient will reduce functional limitation from vertigo from 60% to < or equal to 26%.    Time 4    Period Weeks    Target Date 03/19/20                 Plan - 02/25/20 1417    Clinical Impression Statement The patient returns to PT with resolution of spinning sensations per subjective, but continued dizziness and HA.  Patient had presence of apogeotropic nystagmus viewed in room light with L horizontal roll test.  Treated with R Horiz CRT for cupulolithiasis when nystagmus direction converted ot geotropic indicating that otoconia transitioned from cupulo to canalithiasis.  PT provided habituation for home exercises + foam standing with eyes closed and neck stretch due to upper trap tightness. Vertigo + neck tightness may be contributing to worsening sensations of migraine.    Comorbidities h/o MI, h/o recurrent vertigo    PT Frequency 2x / week    PT Duration 4  weeks    PT Treatment/Interventions Vestibular;Canalith Repostioning;Neuromuscular re-education;Patient/family education;ADLs/Self Care Home Management;Gait training;Manual techniques    PT Next Visit Plan check BPPV and treat, VOR training, HEP for habituation if indicated; provide self treatment of BPPV since recurring  Consulted and Agree with Plan of Care Patient           Patient will benefit from skilled therapeutic intervention in order to improve the following deficits and impairments:     Visit Diagnosis: BPPV (benign paroxysmal positional vertigo), right  Dizziness and giddiness     Problem List Patient Active Problem List   Diagnosis Date Noted  . Benign prostatic hyperplasia with incomplete bladder emptying 01/27/2020  . S/P CABG (coronary artery bypass graft) 02/17/2019  . DDD (degenerative disc disease), cervical 01/29/2019  . Intractable migraine without aura and without status migrainosus 12/25/2018  . History of 2019 novel coronavirus disease (COVID-19) 12/04/2018  . Tinea pedis of both feet 12/04/2018  . Hypertension associated with diabetes (HCC) 12/04/2018  . New daily persistent headache 12/04/2018  . Controlled type 2 diabetes mellitus with diabetic dermatitis, without long-term current use of insulin (HCC) 12/04/2018  . Pruritus ani 11/23/2018  . Cough headache syndrome 11/23/2018  . Respiratory tract infection due to COVID-19 virus 11/23/2018  . Transaminitis 06/30/2018  . Diabetic polyneuropathy associated with type 2 diabetes mellitus (HCC) 06/29/2018  . Colon cancer screening 10/03/2017  . Hyperlipidemia associated with type 2 diabetes mellitus (HCC) 10/03/2017  . Uncontrolled type 2 diabetes mellitus with hyperglycemia (HCC) 10/03/2017  . Combined nonsenile cataract 01/09/2017  . Dermatochalasis 01/09/2017  . Posterior vitreous detachment of left eye 01/09/2017  . 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 Phimosis 09/20/2016  . Vitamin D  insufficiency 05/07/2016  . Hypertriglyceridemia 05/07/2016  . Overweight (BMI 25.0-29.9) 05/06/2016  . Hematuria 11/27/2015  . Hyperglycemia 11/20/2015  . Ischemic cardiomyopathy 11/20/2015  . Exertional angina (HCC) 11/01/2015  . GERD (gastroesophageal reflux disease) 07/10/2015  . SNHL (sensory-neural hearing loss), asymmetrical 06/26/2015  . Vertigo 06/12/2015  . History of MI (myocardial infarction) 05/26/2015  . Hyperlipidemia 05/26/2015  . Elevated liver enzymes 05/26/2015  . Allergic rhinitis 08/17/2012   PHYSICAL THERAPY DISCHARGE SUMMARY  Visits from Start of Care: 2  Current functional level related to goals / functional outcomes:  See above for patient status.   Patient did not return to PT.  Please refer to initial evaluation for patient status. Thank you for the referral of this patient. Margretta Ditty, MPT    Thank you for the referral of this patient. Margretta Ditty, MPT     Kaleena Corrow, PT 02/25/2020, 3:04 PM  [facility] [place] [address] [place], [place], [address] Phone: [phone]   Fax:  [phone]  Name: Zachary Harvey MRN: [id number] Date of Birth: [birth year]

## 2020-02-28 ENCOUNTER — Ambulatory Visit (INDEPENDENT_AMBULATORY_CARE_PROVIDER_SITE_OTHER): Payer: BC Managed Care – PPO | Admitting: Sports Medicine

## 2020-02-28 ENCOUNTER — Other Ambulatory Visit: Payer: Self-pay

## 2020-02-28 ENCOUNTER — Encounter: Payer: Self-pay | Admitting: Sports Medicine

## 2020-02-28 DIAGNOSIS — R42 Dizziness and giddiness: Secondary | ICD-10-CM | POA: Diagnosis not present

## 2020-02-28 MED ORDER — TOPIRAMATE 50 MG PO TABS: ORAL_TABLET | ORAL | 3 refills | Status: DC

## 2020-02-28 NOTE — Progress Notes (Signed)
    Procedures performed today:    None.  Independent interpretation of notes and tests performed by another provider:   Update from vestibular therapist:  Adiel has been seen for eval and one treatment session. He has persistent vertigo that has changed since eval, but was not resolved today.  At eval, he initially had R posterior canalithiasis. During Epley's maneuver, his otoconia repositioned into the horizontal canal (this only happens 5% of the time or less). So, I treated him for R horizontal canalithiasis.   He returned today to report no spinning, however he did have apogeotropic nystagmus with horizontal canal testing indicating R horizontal cupulolithiasis (the otoconia get stuck on the cupula at entrance to [facility]) and this can be more challenging to treat. I treated today and gave him home habituation to continue working on clearing it at home.   His HA have been worse and he has muscle guarding in his neck. I gave him upper trap stretching. I think the HA are going to remain until we can clear this vertigo completely.    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 He has done vestibular therapy, prednisone, Valium, meclizine. We obtained a brain MRI with thin slices through the internal acoustic meatus, looks good. Symptoms have gotten a lot better, I am going to add some topiramate as he is having some headaches that sound migrainous with photophobia, phonophobia, and nausea. He can continue vestibular rehabilitation in the meantime, and he is eager to get back to work. Follow-up with either me or his PCP in a month.    ___________________________________________ [name]. [name], M.D., ABFM., CAQSM. Primary Care and Sports Medicine [facility]  [facility]  Adjunct Instructor of Family Medicine  [employer]

## 2020-02-28 NOTE — Assessment & Plan Note (Signed)
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 He has done vestibular therapy, prednisone, Valium, meclizine. We obtained a brain MRI with thin slices through the internal acoustic meatus, looks good. Symptoms have gotten a lot better, I am going to add some topiramate as he is having some headaches that sound migrainous with photophobia, phonophobia, and nausea. He can continue vestibular rehabilitation in the meantime, and he is eager to get back to work. Follow-up with either me or his PCP in a month.

## 2020-02-29 ENCOUNTER — Telehealth: Payer: Self-pay | Admitting: Osteopathic Medicine

## 2020-02-29 NOTE — Telephone Encounter (Signed)
Patient was in for an appointment on Monday, 02/28/2020 and stated that he forgot to bring paperwork to the appointment so he brought it back by this afternoon (02/29/2020), stated that the appointment he had on Monday was for the paperwork, paperwork left in provider box. AM

## 2020-02-29 NOTE — Telephone Encounter (Signed)
I got them, I will fill them out but remind him in the future I will require an appointment for disability paperwork.

## 2020-03-02 NOTE — Telephone Encounter (Signed)
Spoke to patient's wife, told her paperwork is up front & can be picked up, also made aware that next time patient would definitely need an appointment to have paperwork completed. AM

## 2020-03-10 ENCOUNTER — Other Ambulatory Visit: Payer: Self-pay | Admitting: Nurse Practitioner

## 2020-03-10 ENCOUNTER — Encounter: Payer: Self-pay | Admitting: Rehabilitative and Restorative Service Providers"

## 2020-03-10 DIAGNOSIS — E1165 Type 2 diabetes mellitus with hyperglycemia: Secondary | ICD-10-CM

## 2020-03-27 ENCOUNTER — Ambulatory Visit: Payer: BC Managed Care – PPO | Admitting: Sports Medicine

## 2020-03-28 DIAGNOSIS — G4733 Obstructive sleep apnea (adult) (pediatric): Secondary | ICD-10-CM | POA: Diagnosis not present

## 2020-04-18 DIAGNOSIS — I10 Essential (primary) hypertension: Secondary | ICD-10-CM | POA: Diagnosis not present

## 2020-04-18 DIAGNOSIS — R0602 Shortness of breath: Secondary | ICD-10-CM | POA: Diagnosis not present

## 2020-04-18 DIAGNOSIS — R202 Paresthesia of skin: Secondary | ICD-10-CM | POA: Diagnosis not present

## 2020-04-18 DIAGNOSIS — R079 Chest pain, unspecified: Secondary | ICD-10-CM | POA: Diagnosis not present

## 2020-04-18 DIAGNOSIS — R0789 Other chest pain: Secondary | ICD-10-CM | POA: Diagnosis not present

## 2020-04-18 DIAGNOSIS — R200 Anesthesia of skin: Secondary | ICD-10-CM | POA: Diagnosis not present

## 2020-04-18 DIAGNOSIS — R42 Dizziness and giddiness: Secondary | ICD-10-CM | POA: Diagnosis not present

## 2020-04-18 DIAGNOSIS — R457 State of emotional shock and stress, unspecified: Secondary | ICD-10-CM | POA: Diagnosis not present

## 2020-04-19 ENCOUNTER — Ambulatory Visit (INDEPENDENT_AMBULATORY_CARE_PROVIDER_SITE_OTHER): Payer: BC Managed Care – PPO

## 2020-04-19 ENCOUNTER — Encounter: Payer: Self-pay | Admitting: Osteopathic Medicine

## 2020-04-19 ENCOUNTER — Ambulatory Visit (INDEPENDENT_AMBULATORY_CARE_PROVIDER_SITE_OTHER): Payer: BC Managed Care – PPO | Admitting: Osteopathic Medicine

## 2020-04-19 ENCOUNTER — Other Ambulatory Visit: Payer: Self-pay

## 2020-04-19 VITALS — BP 97/65 | HR 75 | Temp 98.7°F | Wt 175.7 lb

## 2020-04-19 DIAGNOSIS — R42 Dizziness and giddiness: Secondary | ICD-10-CM | POA: Diagnosis not present

## 2020-04-19 DIAGNOSIS — R079 Chest pain, unspecified: Secondary | ICD-10-CM

## 2020-04-19 DIAGNOSIS — R0602 Shortness of breath: Secondary | ICD-10-CM | POA: Diagnosis not present

## 2020-04-19 DIAGNOSIS — R930 Abnormal findings on diagnostic imaging of skull and head, not elsewhere classified: Secondary | ICD-10-CM

## 2020-04-19 DIAGNOSIS — Z951 Presence of aortocoronary bypass graft: Secondary | ICD-10-CM

## 2020-04-19 DIAGNOSIS — R29898 Other symptoms and signs involving the musculoskeletal system: Secondary | ICD-10-CM

## 2020-04-19 MED ORDER — CLOPIDOGREL BISULFATE 75 MG PO TABS: 75.0000 mg | ORAL_TABLET | Freq: Every day | ORAL | 0 refills | Status: DC

## 2020-04-19 MED ORDER — IOHEXOL 350 MG/ML SOLN
100.0000 mL | Freq: Once | INTRAVENOUS | Status: AC | PRN
  Administered 2020-04-19: 14:00:00 100 mL via INTRAVENOUS

## 2020-04-19 MED ORDER — METOPROLOL TARTRATE 25 MG PO TABS
12.5000 mg | ORAL_TABLET | Freq: Two times a day (BID) | ORAL | 0 refills | Status: DC
Start: 2020-04-19 — End: 2020-06-21

## 2020-04-19 MED ORDER — CLOPIDOGREL BISULFATE 75 MG PO TABS
75.0000 mg | ORAL_TABLET | Freq: Every day | ORAL | 0 refills | Status: DC
Start: 2020-04-19 — End: 2020-04-19

## 2020-04-19 MED ORDER — NITROGLYCERIN 0.4 MG SL SUBL: 0.4000 mg | SUBLINGUAL_TABLET | SUBLINGUAL | 0 refills | Status: DC | PRN

## 2020-04-19 NOTE — Progress Notes (Addendum)
Zachary Harvey is a 59 y.o. male who presents to  [facility] at [facility]  today, 04/19/20, seeking care for the following:  . SOB ongoing a few days, S/p CABG, records reviewed below. Rerpots chest pressure centrally. Did not take nitro because was told not to by 911 (?). He is on metoprolol and isosorbide, he is on ASA 162 mg daily and Lipitor 80 mg. No ACE/ARB likely d/t relative low BP . CP ongoing since yesterday, EMS took him to ER, those records were reviewed. CTABL . L leg weakness and L arm weakness w/ paresthesia - started 1 weeks ago, improved but not resolved in L leg. On exam L leg weaker compared to R w/ hip flexion, knee ext/flex against resistance. L arm and R arm symmetrical strength. CN WNL.     04/18/20 in ER:  CT head  No definite acute intracranial abnormality. No acute intracranial hemorrhage. Patchy area of low-attenuation in the right frontal lobe subcortical white matter described above is nonspecific and typically attributed to chronic microvascular ischemia especially in the setting of the patient's known cardiovascular risk factors. However, if there is clinical concern for acute infarct recommend brain MRI for further evaluation.  CXR ok  Troponin: 3 on two measurements  Glc 153 CBC ok No D-Dimer     07/20/19  STRESS ECHO  Normal left ventricular function and global wall motion with stress.  There was normal increase in global LV function post exercise.  Negative exercise echocardiography for inducible ischemia at target heart rate.       ASSESSMENT & PLAN with other pertinent findings:  The primary encounter diagnosis was Chest pain, unspecified type. Diagnoses of Vertigo, S/P CABG (coronary artery bypass graft), Shortness of breath, Weakness of left lower extremity, and Abnormal CT of the head were also pertinent to this visit.   No results found for this or any previous visit (from the past 24  hour(s)).  Persistent CP/SOB in pt w/ known CAD and DM2 No apparent PE w/u in ER - no CT chest or D-Dimer   Patient DECLINED my recommendation to pursue direct admission for at least obs status to eval for CVA and consult cardiology r/o ACS eval for other stress testing. He cites financial concerns. H estates he was also offered admission to [facility] yesterday, but declined. I do not have progress notes available to review at this time (04/19/20 10:58 AM) to confirm this, but he cited financial concerns were the issue yesterday as well. I advised again that I think he needs to be admitted but he declines.    Patient Instructions  Plan:  Chest pain - most likely angina because of your heart problems. I've refilled the nitroglycerin. TAKE THIS AND CALL 911 IF YOU HAVE CHEST PAIN! Don't let 911 or EMS tell you not to.   Trouble breathing - may be from chest pain as above, but I'm worried about blood clot in the lung or problem with large blood vessels in the chest . Let's get a CT scan right now to evaluate for this. If there is a blood clot in the lungs (Pulmonary Embolus aka PE) then we will need to start blood thinners. If it's a serious problem with one of the blood vessels, this may require urgent hospital care.   Weakness - the CT scan of the head in the ER was not totally normal and an MRI was recommended to get a better picture to help determine  if you had a stroke. I am very concerned you may have had a stroke! We need to get an MRI of the brain.   IF WORSE CHEST PAIN OR WEAKNESS, OR TROUBLE BREATHING, PLEASE GO TO A HOSPITAL!   So plan:  CT chest today  If blood clot in lung, will stop aspirin and start blood thinner medications  If no blood clot, I'm going to start Plavix in addition to aspirin to prevent stroke until we can get...   MRI brain to confirm whether or not there was a stroke  If stroke, will need a neurologist   If no stroke, nothing else to do and can stop  Plavix    Orders Placed This Encounter  Procedures  . MR Brain W Wo Contrast  . CT Angio Chest W/Cm &/Or Wo Cm    Meds ordered this encounter  Medications  . nitroGLYCERIN (NITROSTAT) 0.4 MG SL tablet    Sig: Place 1 tablet (0.4 mg total) under the tongue every 5 (five) minutes as needed for chest pain (or tightness).    Dispense:  30 tablet    Refill:  0  . clopidogrel (PLAVIX) 75 MG tablet    Sig: Take 1 tablet (75 mg total) by mouth daily.    Dispense:  30 tablet    Refill:  0        ADDENDUM 04/19/20 2:41 PM   CT Angio Chest W/Cm &/Or Wo Cm  Result Date: 04/19/2020 CLINICAL DATA:  Shortness of breath and chest pain. EXAM: CT ANGIOGRAPHY CHEST WITH CONTRAST TECHNIQUE: Multidetector CT imaging of the chest was performed using the standard protocol during bolus administration of intravenous contrast. Multiplanar CT image reconstructions and MIPs were obtained to evaluate the vascular anatomy. CONTRAST:  OMNIPAQUE IOHEXOL 350 MG/ML SOLN COMPARISON:  None. FINDINGS: Cardiovascular: The heart size is normal. No substantial pericardial effusion. Coronary artery calcification is evident. Status post CABG. Atherosclerotic calcification is noted in the wall of the thoracic aorta. There is no filling defect within the opacified pulmonary arteries to suggest the presence of an acute pulmonary embolus. Mediastinum/Nodes: No mediastinal lymphadenopathy. There is no hilar lymphadenopathy. The esophagus has normal imaging features. There is no axillary lymphadenopathy. Lungs/Pleura: Calcified granuloma noted left upper lobe. No suspicious pulmonary nodule or mass. No focal airspace consolidation. No pleural effusion. Upper Abdomen: Unremarkable. Musculoskeletal: No intraperitoneal free fluid. Review of the MIP images confirms the above findings. IMPRESSION: 1. No CT evidence for acute pulmonary embolus. 2. No acute findings in the chest. 3. Aortic Atherosclerosis (ICD10-I70.0).  Electronically Signed   By: Kennith Center M.D.   On: 04/19/2020 14:17   Chest/lungs look okay Will sent Plavix to take along w/ Aspirin gor stroke prevention Needs MRI brain      Follow-up instructions: Return for RECHECK PENDING RESULTS / IF WORSE OR CHANGE.                                         BP 97/65   Pulse 75   Temp 98.7 F (37.1 C)   Wt 175 lb 11.2 oz (79.7 kg)   SpO2 97%   BMI 27.52 kg/m   No outpatient medications have been marked as taking for the 04/19/20 encounter (Office Visit) with Sunnie Nielsen, DO.    No results found for this or any previous visit (from the past 72 hour(s)).  No results found.  All questions at time of visit were answered - patient instructed to contact office with any additional concerns or updates.  ER/RTC precautions were reviewed with the patient as applicable.   Please note: voice recognition software was used to produce this document, and typos may escape review. Please contact Dr. Lyn Hollingshead for any needed clarifications.   Total encounter time: 60 minutes.

## 2020-04-19 NOTE — Addendum Note (Signed)
Addended by: Deirdre Pippins on: 04/19/2020 02:42 PM   Modules accepted: Orders

## 2020-04-19 NOTE — Patient Instructions (Addendum)
Plan:  Chest pain - most likely angina because of your heart problems. I've refilled the nitroglycerin. TAKE THIS AND CALL 911 IF YOU HAVE CHEST PAIN! Don't let 911 or EMS tell you not to.   Trouble breathing - may be from chest pain as above, but I'm worried about blood clot in the lung or problem with large blood vessels in the chest . Let's get a CT scan right now to evaluate for this. If there is a blood clot in the lungs (Pulmonary Embolus aka PE) then we will need to start blood thinners. If it's a serious problem with one of the blood vessels, this may require urgent hospital care.   Weakness - the CT scan of the head in the ER was not totally normal and an MRI was recommended to get a better picture to help determine if you had a stroke. I am very concerned you may have had a stroke! We need to get an MRI of the brain.   IF WORSE CHEST PAIN OR WEAKNESS, OR TROUBLE BREATHING, PLEASE GO TO A HOSPITAL! You have DECLINED recommendation for admission to the hospital now.    So plan:  Change medications  Metoprolol 50 mg from 25 mg twice daily - to Metoprolol 25 mg take half of this dose for 12.5 mg twice daily. Be sure you are on this dose - our list did not match cardiology's list, I'm going with cardiology's recommendations!   Refilled Nitro for as-needed chest pain   CT chest today  If blood clot in lung, will stop aspirin and start blood thinner medications  If no blood clot, I'm going to start Plavix in addition to aspirin to prevent stroke until we can get...   MRI brain to confirm whether or not there was a stroke  If stroke, will need a neurologist and more tests  If no stroke, nothing else to do and can stop Plavix if this was started

## 2020-04-20 ENCOUNTER — Encounter: Payer: Self-pay | Admitting: Osteopathic Medicine

## 2020-04-20 DIAGNOSIS — R9431 Abnormal electrocardiogram [ECG] [EKG]: Secondary | ICD-10-CM | POA: Diagnosis not present

## 2020-04-24 ENCOUNTER — Other Ambulatory Visit: Payer: Self-pay

## 2020-04-24 ENCOUNTER — Ambulatory Visit (INDEPENDENT_AMBULATORY_CARE_PROVIDER_SITE_OTHER): Payer: BC Managed Care – PPO

## 2020-04-24 DIAGNOSIS — R531 Weakness: Secondary | ICD-10-CM | POA: Diagnosis not present

## 2020-04-24 DIAGNOSIS — R202 Paresthesia of skin: Secondary | ICD-10-CM | POA: Diagnosis not present

## 2020-04-24 DIAGNOSIS — R200 Anesthesia of skin: Secondary | ICD-10-CM | POA: Diagnosis not present

## 2020-04-24 DIAGNOSIS — R42 Dizziness and giddiness: Secondary | ICD-10-CM | POA: Diagnosis not present

## 2020-04-24 DIAGNOSIS — M6281 Muscle weakness (generalized): Secondary | ICD-10-CM

## 2020-04-24 MED ORDER — GADOBUTROL 1 MMOL/ML IV SOLN
7.5000 mL | Freq: Once | INTRAVENOUS | Status: AC | PRN
  Administered 2020-04-24: 11:00:00 7.5 mL via INTRAVENOUS

## 2020-04-26 ENCOUNTER — Ambulatory Visit (INDEPENDENT_AMBULATORY_CARE_PROVIDER_SITE_OTHER): Payer: BC Managed Care – PPO | Admitting: Osteopathic Medicine

## 2020-04-26 ENCOUNTER — Encounter: Payer: Self-pay | Admitting: Osteopathic Medicine

## 2020-04-26 VITALS — BP 102/63 | HR 73 | Temp 97.6°F | Wt 176.4 lb

## 2020-04-26 DIAGNOSIS — F419 Anxiety disorder, unspecified: Secondary | ICD-10-CM | POA: Diagnosis not present

## 2020-04-26 DIAGNOSIS — I25118 Atherosclerotic heart disease of native coronary artery with other forms of angina pectoris: Secondary | ICD-10-CM

## 2020-04-26 DIAGNOSIS — K219 Gastro-esophageal reflux disease without esophagitis: Secondary | ICD-10-CM

## 2020-04-26 MED ORDER — ESCITALOPRAM OXALATE 5 MG PO TABS: 5.0000 mg | ORAL_TABLET | Freq: Every day | ORAL | 1 refills | Status: DC

## 2020-04-26 MED ORDER — PANTOPRAZOLE SODIUM 40 MG PO TBEC: 40.0000 mg | DELAYED_RELEASE_TABLET | Freq: Every day | ORAL | 0 refills | Status: DC

## 2020-04-26 NOTE — Patient Instructions (Addendum)
Anxiety - Lexapro 5 mg to take daily to prevent anxiety, this may start to improve your chest pressure over the next few weeks  Stomach - Pantoprazole 40 mg daily for 6 weeks   Nitroglycerin sublingual tablets What is this medicine? NITROGLYCERIN (nye troe GLI ser in) is a type of vasodilator. It relaxes blood vessels, increasing the blood and oxygen supply to your heart. This medicine is used to relieve chest pain caused by angina. It is also used to prevent chest pain before activities like climbing stairs, going outdoors in cold weather, or sexual activity. This medicine may be used for other purposes; ask your health care provider or pharmacist if you have questions. COMMON BRAND NAME(S): Nitroquick, Nitrostat, Nitrotab How should I use this medicine? Take this medicine by mouth as needed. At the first sign of an angina attack (chest pain or tightness) place one tablet under your tongue. You can also take this medicine 5 to 10 minutes before an event likely to produce chest pain. Follow the directions on the prescription label. Let the tablet dissolve under the tongue. Do not swallow whole. Replace the dose if you accidentally swallow it. It will help if your mouth is not dry. Saliva around the tablet will help it to dissolve more quickly. Do not eat or drink, smoke or chew tobacco while a tablet is dissolving. If you are not better within 5 minutes after taking ONE dose of nitroglycerin, call 9-1-1 immediately to seek emergency medical care. Do not take more than 3 nitroglycerin tablets over 15 minutes. If you take this medicine often to relieve symptoms of angina, your doctor or health care professional may provide you with different instructions to manage your symptoms. If symptoms do not go away after following these instructions, it is important to call 9-1-1 immediately. Do not take more than 3 nitroglycerin tablets over 15 minutes. Talk to your pediatrician regarding the use of this medicine  in children. Special care may be needed. Overdosage: If you think you have taken too much of this medicine contact a poison control center or emergency room at once. NOTE: This medicine is only for you. Do not share this medicine with others. What if I miss a dose? This does not apply. This medicine is only used as needed. What should I watch for while using this medicine? Tell your doctor or health care professional if you feel your medicine is no longer working. Keep this medicine with you at all times. Sit or lie down when you take your medicine to prevent falling if you feel dizzy or faint after using it. Try to remain calm. This will help you to feel better faster. If you feel dizzy, take several deep breaths and lie down with your feet propped up, or bend forward with your head resting between your knees. You may get drowsy or dizzy. Do not drive, use machinery, or do anything that needs mental alertness until you know how this drug affects you. Do not stand or sit up quickly, especially if you are an older patient. This reduces the risk of dizzy or fainting spells. Alcohol can make you more drowsy and dizzy. Avoid alcoholic drinks. Do not treat yourself for coughs, colds, or pain while you are taking this medicine without asking your doctor or health care professional for advice. Some ingredients may increase your blood pressure. What side effects may I notice from receiving this medicine? Side effects that you should report to your doctor or health care professional as  soon as possible:  blurred vision  dry mouth  skin rash  sweating  the feeling of extreme pressure in the head  unusually weak or tired Side effects that usually do not require medical attention (report to your doctor or health care professional if they continue or are bothersome):  flushing of the face or neck  headache  irregular heartbeat, palpitations  nausea, vomiting This list may not describe all  possible side effects. Call your doctor for medical advice about side effects. You may report side effects to FDA at 1-800-FDA-1088. Where should I keep my medicine? Keep out of the reach of children. Store at room temperature between 20 and 25 degrees C (68 and 77 degrees F). Store in Retail buyer. Protect from light and moisture. Keep tightly closed. Throw away any unused medicine after the expiration date. NOTE: This sheet is a summary. It may not cover all possible information. If you have questions about this medicine, talk to your doctor, pharmacist, or health care provider.  2020 [employer]/Gold Standard (2013-02-11 17:57:36)

## 2020-04-26 NOTE — Progress Notes (Signed)
Zachary Harvey is a 59 y.o. male who presents to  [place] at [facility]  today, 04/26/20, seeking care for the following:  . Pt was in ER 04/18/20 for chest pain and L leg numbness, also c/o SOB, ACS reasonably r/o with serial troponins and EKG but has significant cardiac Hx, no D-Dimer done, CR head abnormal, patient declined admission/obs. Was in this office next day 04/19/20, we arranged CTA which was negative for PE, we started DAPT for presumed CVA d/t L leg numbness and abnormal head CT, we were able to obtain repeat MRI brain which was reassuring for no new CVA, pt had intolerance to Plavix anyway so we recommended high-dose ASA. We refilled nitro for prn chest pain.  . Pt repots today he is feeling better but still having significant chest pressure, constant, not worse on exertion. He is no longer c/o SOB or L sided numbness. He is concerned that GERD/anxiety might be causing his symptoms. He reports frequent heartburn and discomfort w/ swallowing but no choking or difficulty swallowing or regurgitation. H'es gone back down to ASA 81 mg daily.   BP Readings from Last 3 Encounters:  04/26/20 102/63  04/19/20 97/65  02/28/20 112/61   Wt Readings from Last 3 Encounters:  04/26/20 176 lb 6.4 oz (80 kg)  04/19/20 175 lb 11.2 oz (79.7 kg)  02/28/20 175 lb (79.4 kg)     BP 102/63 Weight 176 lb Height: 5'7" BMI Body mass index is 27.63 kg/m. Cholesterol LDL 77, TC 154, HDL 40  Glucose: A1C 6.7    ASSESSMENT & PLAN with other pertinent findings:  The primary encounter diagnosis was Coronary artery disease of native heart with stable angina pectoris, unspecified vessel or lesion type ([place]). Diagnoses of Gastroesophageal reflux disease without esophagitis and Anxiety were also pertinent to this visit.   No results found for this or any previous visit (from the past 24 hour(s)).  CAD secondary prevention and also avoid CVA:  ASA - no  apparent CVA and intolerant to higher dose ASA 9reports itching) and Plavix (itching), continue ASA 81 mg Statin - continue high potency statin w/ Lipitor 80 mg daily Beta blocker - taking Lopressor 12.5 mg bid ACE/ARB - not added d/t low BP BP control - BP borderline hypotensive  Angina - Isosorbide mononitrate 20 mg bid   Cardiology notes from 01/26/20 were again reviewed today, recommended annual follow-up - I think patient should see them sooner, he will contact them for appointment    Patient Instructions  Anxiety - Lexapro 5 mg to take daily to prevent anxiety, this may start to improve your chest pressure over the next few weeks  Stomach - Pantoprazole 40 mg daily for 6 weeks   Nitroglycerin sublingual tablets What is this medicine? NITROGLYCERIN (nye troe GLI ser in) is a type of vasodilator. It relaxes blood vessels, increasing the blood and oxygen supply to your heart. This medicine is used to relieve chest pain caused by angina. It is also used to prevent chest pain before activities like climbing stairs, going outdoors in cold weather, or sexual activity. This medicine may be used for other purposes; ask your health care provider or pharmacist if you have questions. COMMON BRAND NAME(S): Nitroquick, Nitrostat, Nitrotab How should I use this medicine? Take this medicine by mouth as needed. At the first sign of an angina attack (chest pain or tightness) place one tablet under your tongue. You can also take this medicine 5  to 10 minutes before an event likely to produce chest pain. Follow the directions on the prescription label. Let the tablet dissolve under the tongue. Do not swallow whole. Replace the dose if you accidentally swallow it. It will help if your mouth is not dry. Saliva around the tablet will help it to dissolve more quickly. Do not eat or drink, smoke or chew tobacco while a tablet is dissolving. If you are not better within 5 minutes after taking ONE dose of  nitroglycerin, call 9-1-1 immediately to seek emergency medical care. Do not take more than 3 nitroglycerin tablets over 15 minutes. If you take this medicine often to relieve symptoms of angina, your doctor or health care professional may provide you with different instructions to manage your symptoms. If symptoms do not go away after following these instructions, it is important to call 9-1-1 immediately. Do not take more than 3 nitroglycerin tablets over 15 minutes. Talk to your pediatrician regarding the use of this medicine in children. Special care may be needed. Overdosage: If you think you have taken too much of this medicine contact a poison control center or emergency room at once. NOTE: This medicine is only for you. Do not share this medicine with others. What if I miss a dose? This does not apply. This medicine is only used as needed. What should I watch for while using this medicine? Tell your doctor or health care professional if you feel your medicine is no longer working. Keep this medicine with you at all times. Sit or lie down when you take your medicine to prevent falling if you feel dizzy or faint after using it. Try to remain calm. This will help you to feel better faster. If you feel dizzy, take several deep breaths and lie down with your feet propped up, or bend forward with your head resting between your knees. You may get drowsy or dizzy. Do not drive, use machinery, or do anything that needs mental alertness until you know how this drug affects you. Do not stand or sit up quickly, especially if you are an older patient. This reduces the risk of dizzy or fainting spells. Alcohol can make you more drowsy and dizzy. Avoid alcoholic drinks. Do not treat yourself for coughs, colds, or pain while you are taking this medicine without asking your doctor or health care professional for advice. Some ingredients may increase your blood pressure. What side effects may I notice from  receiving this medicine? Side effects that you should report to your doctor or health care professional as soon as possible:  blurred vision  dry mouth  skin rash  sweating  the feeling of extreme pressure in the head  unusually weak or tired Side effects that usually do not require medical attention (report to your doctor or health care professional if they continue or are bothersome):  flushing of the face or neck  headache  irregular heartbeat, palpitations  nausea, vomiting This list may not describe all possible side effects. Call your doctor for medical advice about side effects. You may report side effects to FDA at 1-800-FDA-1088. Where should I keep my medicine? Keep out of the reach of children. Store at room temperature between 20 and 25 degrees C (68 and 77 degrees F). Store in [facility] of [employer]. Protect from light and moisture. Keep tightly closed. Throw away any unused medicine after the expiration date. NOTE: This sheet is a summary. It may not cover all possible information. If you have questions about  this medicine, talk to your doctor, pharmacist, or health care provider.  2020 Elsevier/Gold Standard (2013-02-11 17:57:36)    No orders of the defined types were placed in this encounter.   Meds ordered this encounter  Medications  . pantoprazole (PROTONIX) 40 MG tablet    Sig: Take 1 tablet (40 mg total) by mouth daily.    Dispense:  45 tablet    Refill:  0  . escitalopram (LEXAPRO) 5 MG tablet    Sig: Take 1 tablet (5 mg total) by mouth daily.    Dispense:  90 tablet    Refill:  1       Follow-up instructions: Return in about 6 weeks (around 06/07/2020) for MONITOR A1C, MENTAL HEALTH, ACIFD REFLUX .                                         BP 102/63   Pulse 73   Temp 97.6 F (36.4 C)   Wt 176 lb 6.4 oz (80 kg)   SpO2 98%   BMI 27.63 kg/m   Current Meds  Medication Sig  . AMBULATORY NON FORMULARY  MEDICATION Single glucometer with lancets, test strips  . aspirin 81 MG tablet Take 81 mg by mouth daily.  Marland Kitchen atorvastatin (LIPITOR) 80 MG tablet Take 80 mg by mouth daily.  . betamethasone dipropionate 0.05 % cream Apply topically 2 (two) times daily. To affected area(s) as needed  . blood glucose meter kit and supplies KIT Check morning fasting glucose daily and up to four times daily as needed  . diazepam (VALIUM) 5 MG tablet Take 1 tablet (5 mg total) by mouth every 8 (eight) hours as needed (vertigo/dizziness).  Marland Kitchen escitalopram (LEXAPRO) 5 MG tablet Take 1 tablet (5 mg total) by mouth daily.  Marland Kitchen FARXIGA 10 MG TABS tablet Take 1 tablet by mouth once daily  . fexofenadine (ALLEGRA) 180 MG tablet Take 180 mg by mouth daily.  . isosorbide mononitrate (ISMO) 20 MG tablet Take 20 mg by mouth 2 (two) times daily.  . Levocetirizine Dihydrochloride (XYZAL PO) Take by mouth.  . meclizine (ANTIVERT) 25 MG tablet Take 1 tablet (25 mg total) by mouth 3 (three) times daily as needed for dizziness.  . metoprolol tartrate (LOPRESSOR) 25 MG tablet Take 0.5 tablets (12.5 mg total) by mouth 2 (two) times daily.  . nitroGLYCERIN (NITROSTAT) 0.4 MG SL tablet Place 1 tablet (0.4 mg total) under the tongue every 5 (five) minutes as needed for chest pain (or tightness).  Glory Rosebush ULTRA test strip CHECK MORNING FASTING GLUCOSE DAILY AND UP TO 4 TIMES DAILY AS NEEDED  . pantoprazole (PROTONIX) 40 MG tablet Take 1 tablet (40 mg total) by mouth daily.  . tamsulosin (FLOMAX) 0.4 MG CAPS capsule Take 1 capsule (0.4 mg total) by mouth daily.  Marland Kitchen topiramate (TOPAMAX) 50 MG tablet One half tab by mouth daily for one week then one tab by mouth daily.  . [DISCONTINUED] clopidogrel (PLAVIX) 75 MG tablet Take 1 tablet (75 mg total) by mouth daily.  . [DISCONTINUED] Lidocaine-Hydrocortisone Ace 3-0.5 % KIT Place 1 application rectally 2 (two) times daily as needed (rectal itching).  . [DISCONTINUED] Nitroglycerin 0.4 % OINT Place  1 inch rectally every 12 (twelve) hours as needed (for hemorrhoids).  . [DISCONTINUED] oxycodone (OXY-IR) 5 MG capsule Take 5 mg by mouth every 4 (four) hours as needed.    No  results found for this or any previous visit (from the past 11 hour(s)).  CT Angio Chest W/Cm &/Or Wo Cm  Result Date: 04/19/2020 CLINICAL DATA:  Shortness of breath and chest pain. EXAM: CT ANGIOGRAPHY CHEST WITH CONTRAST TECHNIQUE: Multidetector CT imaging of the chest was performed using the standard protocol during bolus administration of intravenous contrast. Multiplanar CT image reconstructions and MIPs were obtained to evaluate the vascular anatomy. CONTRAST:  176m OMNIPAQUE IOHEXOL 350 MG/ML SOLN COMPARISON:  None. FINDINGS: Cardiovascular: The heart size is normal. No substantial pericardial effusion. Coronary artery calcification is evident. Status post CABG. Atherosclerotic calcification is noted in the wall of the thoracic aorta. There is no filling defect within the opacified pulmonary arteries to suggest the presence of an acute pulmonary embolus. Mediastinum/Nodes: No mediastinal lymphadenopathy. There is no hilar lymphadenopathy. The esophagus has normal imaging features. There is no axillary lymphadenopathy. Lungs/Pleura: Calcified granuloma noted left upper lobe. No suspicious pulmonary nodule or mass. No focal airspace consolidation. No pleural effusion. Upper Abdomen: Unremarkable. Musculoskeletal: No intraperitoneal free fluid. Review of the MIP images confirms the above findings. IMPRESSION: 1. No CT evidence for acute pulmonary embolus. 2. No acute findings in the chest. 3. Aortic Atherosclerosis (ICD10-I70.0). Electronically Signed   By: EMisty StanleyM.D.   On: 04/19/2020 14:17   MR Brain W Wo Contrast  Result Date: 04/24/2020 CLINICAL DATA:  Recent stroke. Dizziness. Bilateral arm weakness. Left leg numbness. EXAM: MRI HEAD WITHOUT AND WITH CONTRAST TECHNIQUE: Multiplanar, multiecho pulse sequences of  the brain and surrounding structures were obtained without and with intravenous contrast. CONTRAST:  7.582mGADAVIST GADOBUTROL 1 MMOL/ML IV SOLN COMPARISON:  Brain MRI 02/21/2020. FINDINGS: Brain: Diffusion imaging does not show any acute or subacute infarction. The brainstem and cerebellum are normal. Cerebral hemispheres are normal except for a very few punctate foci of T2 and FLAIR signal in the white matter, not likely significant. No cortical or large vessel territory infarction. No mass, hemorrhage, hydrocephalus or extra-axial collection. After contrast administration, no abnormal enhancement occurs. Vascular: Major vessels at the base of the brain show flow. Skull and upper cervical spine: Negative Sinuses/Orbits: Clear/normal Other: None IMPRESSION: No change since 02/21/2020. No acute or subacute infarction. Normal except for a few punctate white matter foci in the cerebral hemispheres, not likely significant. Electronically Signed   By: MaNelson Chimes.D.   On: 04/24/2020 11:45   MR BRAIN/IAC W WO CONTRAST  Result Date: 02/21/2020 CLINICAL DATA:  Vertigo for 1 week.  Sensorineural hearing loss. EXAM: MRI HEAD WITHOUT AND WITH CONTRAST TECHNIQUE: Multiplanar, multiecho pulse sequences of the brain and surrounding structures were obtained without and with intravenous contrast. CONTRAST:  7.91m79mADAVIST GADOBUTROL 1 MMOL/ML IV SOLN COMPARISON:  12/04/2018 head CT. FINDINGS: Brain: No diffusion-weighted signal abnormality. No intracranial hemorrhage. No midline shift, ventriculomegaly or extra-axial fluid collection. No mass lesion. No abnormal enhancement. Scattered T2 hyperintense foci involving the periventricular and subcortical white matter. Cerebral volume is within normal limits. Normal appearance of the cerebellopontine angles and internal auditory canals. The seventh and eighth cranial nerves are distinct within the IACs. Normal bilateral inner ear morphology. The bilateral trigeminal nerves and  Meckel's caves are unremarkable. Vascular: Normal flow voids. Skull and upper cervical spine: Normal marrow signal. Sinuses/Orbits: Normal orbits. Clear paranasal sinuses. No mastoid effusion. Other: None. IMPRESSION: No acute intracranial process.  No abnormal enhancement. Scattered supratentorial white matter signal abnormalities are nonspecific. Differential includes chronic microvascular ischemic changes, migraines, post infectious/inflammatory sequela. Lower on the differential  is demyelination and vasculitis. Normal MRI appearance of the temporal bones. Electronically Signed   By: Primitivo Gauze M.D.   On: 02/21/2020 17:01      All questions at time of visit were answered - patient instructed to contact office with any additional concerns or updates.  ER/RTC precautions were reviewed with the patient as applicable.   Please note: voice recognition software was used to produce this document, and typos may escape review. Please contact Dr. Sheppard Coil for any needed clarifications.

## 2020-06-20 ENCOUNTER — Other Ambulatory Visit: Payer: Self-pay | Admitting: Osteopathic Medicine

## 2020-08-08 DIAGNOSIS — G4733 Obstructive sleep apnea (adult) (pediatric): Secondary | ICD-10-CM | POA: Diagnosis not present

## 2020-09-18 ENCOUNTER — Other Ambulatory Visit: Payer: Self-pay | Admitting: Osteopathic Medicine

## 2020-09-18 DIAGNOSIS — E1165 Type 2 diabetes mellitus with hyperglycemia: Secondary | ICD-10-CM

## 2020-10-19 ENCOUNTER — Ambulatory Visit: Payer: BC Managed Care – PPO | Admitting: Osteopathic Medicine

## 2020-10-19 ENCOUNTER — Other Ambulatory Visit: Payer: Self-pay

## 2020-10-19 VITALS — BP 118/68 | HR 67 | Temp 98.3°F | Wt 177.0 lb

## 2020-10-19 DIAGNOSIS — E785 Hyperlipidemia, unspecified: Secondary | ICD-10-CM

## 2020-10-19 DIAGNOSIS — R3914 Feeling of incomplete bladder emptying: Secondary | ICD-10-CM

## 2020-10-19 DIAGNOSIS — B351 Tinea unguium: Secondary | ICD-10-CM

## 2020-10-19 DIAGNOSIS — I252 Old myocardial infarction: Secondary | ICD-10-CM

## 2020-10-19 DIAGNOSIS — N401 Enlarged prostate with lower urinary tract symptoms: Secondary | ICD-10-CM

## 2020-10-19 DIAGNOSIS — I25118 Atherosclerotic heart disease of native coronary artery with other forms of angina pectoris: Secondary | ICD-10-CM

## 2020-10-19 DIAGNOSIS — E1159 Type 2 diabetes mellitus with other circulatory complications: Secondary | ICD-10-CM

## 2020-10-19 DIAGNOSIS — R0981 Nasal congestion: Secondary | ICD-10-CM

## 2020-10-19 DIAGNOSIS — E1165 Type 2 diabetes mellitus with hyperglycemia: Secondary | ICD-10-CM | POA: Diagnosis not present

## 2020-10-19 DIAGNOSIS — I152 Hypertension secondary to endocrine disorders: Secondary | ICD-10-CM

## 2020-10-19 DIAGNOSIS — E1169 Type 2 diabetes mellitus with other specified complication: Secondary | ICD-10-CM

## 2020-10-19 DIAGNOSIS — G4733 Obstructive sleep apnea (adult) (pediatric): Secondary | ICD-10-CM

## 2020-10-19 DIAGNOSIS — Z951 Presence of aortocoronary bypass graft: Secondary | ICD-10-CM

## 2020-10-19 DIAGNOSIS — Z9989 Dependence on other enabling machines and devices: Secondary | ICD-10-CM

## 2020-10-19 MED ORDER — AMBULATORY NON FORMULARY MEDICATION: 99 refills | Status: AC

## 2020-10-19 MED ORDER — ESCITALOPRAM OXALATE 5 MG PO TABS: 5.0000 mg | ORAL_TABLET | Freq: Every day | ORAL | 3 refills | Status: DC

## 2020-10-19 MED ORDER — ISOSORBIDE MONONITRATE 20 MG PO TABS: 20.0000 mg | ORAL_TABLET | Freq: Two times a day (BID) | ORAL | 3 refills | Status: AC

## 2020-10-19 MED ORDER — BETAMETHASONE DIPROPIONATE 0.05 % EX CREA: TOPICAL_CREAM | Freq: Two times a day (BID) | CUTANEOUS | 1 refills | Status: DC

## 2020-10-19 MED ORDER — DAPAGLIFLOZIN PROPANEDIOL 10 MG PO TABS: 10.0000 mg | ORAL_TABLET | Freq: Every day | ORAL | 3 refills | Status: DC

## 2020-10-19 MED ORDER — ATORVASTATIN CALCIUM 80 MG PO TABS: 80.0000 mg | ORAL_TABLET | Freq: Every day | ORAL | 3 refills | Status: AC

## 2020-10-19 MED ORDER — TAMSULOSIN HCL 0.4 MG PO CAPS: 0.4000 mg | ORAL_CAPSULE | Freq: Every day | ORAL | 3 refills | Status: DC

## 2020-10-19 MED ORDER — METOPROLOL TARTRATE 25 MG PO TABS: ORAL_TABLET | ORAL | 3 refills | Status: DC

## 2020-10-19 NOTE — Progress Notes (Signed)
Zachary Harvey is a 60 y.o. male who presents to  [place] at [facility]  today, 10/19/20, seeking care for the following:  6 months follow-up  Concern for yellow, thickened toenails. On exam, significantly thick nails on 1st toes biletally, no surrounding erythema Needs new CPAP equipment, he is using CPAP consistently and benefiting from it  Needs refills on Rx      ASSESSMENT & PLAN with other pertinent findings:  The primary encounter diagnosis was Coronary artery disease of native heart with stable angina pectoris, unspecified vessel or lesion type ([place]). Diagnoses of History of MI (myocardial infarction), S/P CABG (coronary artery bypass graft), Uncontrolled type 2 diabetes mellitus with hyperglycemia ([place]), Onychomycosis, Hypertension associated with diabetes ([place]), Hyperlipidemia associated with type 2 diabetes mellitus ([place]), Obstructive sleep apnea on CPAP, Nasal congestion, Sinus congestion, and Benign prostatic hyperplasia with incomplete bladder emptying were also pertinent to this visit.  1. Coronary artery disease of native heart with stable angina pectoris, unspecified vessel or lesion type ([place]) 2. History of MI (myocardial infarction) 3. S/P CABG (coronary artery bypass graft) Following w/ cardiology On ASA, BB, Farxiga, Statin  4. Uncontrolled type 2 diabetes mellitus with hyperglycemia (HCC) A1C pending   5. Onychomycosis Referral to podiatry, I think may need to remove nails   6. Hypertension associated with diabetes ([place]) BP at goal   7. Hyperlipidemia associated with type 2 diabetes mellitus ([place]) Labs pending  8. Obstructive sleep apnea on CPAP Renewed Rx   9. Nasal congestion 10. Sinus congestion OTC allergy meds helping  11. Benign prostatic hyperplasia with incomplete bladder emptying Refilled Rx    Patient Instructions  Refills sent Will send order for new CPAP Referral to podiatry for toenails   Blood work today  Orders Placed This Encounter  Procedures   CBC   COMPLETE METABOLIC PANEL WITH GFR   Lipid panel   Hemoglobin A1c   PSA, Total with Reflex to PSA, Free   Microalbumin / creatinine urine ratio   Ambulatory referral to Podiatry    Meds ordered this encounter  Medications   AMBULATORY NON FORMULARY MEDICATION    Sig: Supply ordered: CPAP and other supplies needed (headgear, cushions, filters, heated tuubing and water chamber) Dx: obstructive sleep apnea Settings: auto-titration 5-20 cmH2O    Dispense:  1 Units    Refill:  99   atorvastatin (LIPITOR) 80 MG tablet    Sig: Take 1 tablet (80 mg total) by mouth daily.    Dispense:  90 tablet    Refill:  3   betamethasone dipropionate 0.05 % cream    Sig: Apply topically 2 (two) times daily. To affected area(s) as needed    Dispense:  45 g    Refill:  1   escitalopram (LEXAPRO) 5 MG tablet    Sig: Take 1 tablet (5 mg total) by mouth daily.    Dispense:  90 tablet    Refill:  3   dapagliflozin propanediol (FARXIGA) 10 MG TABS tablet    Sig: Take 1 tablet (10 mg total) by mouth daily.    Dispense:  90 tablet    Refill:  3   isosorbide mononitrate (ISMO) 20 MG tablet    Sig: Take 1 tablet (20 mg total) by mouth 2 (two) times daily.    Dispense:  180 tablet    Refill:  3   metoprolol tartrate (LOPRESSOR) 25 MG tablet    Sig: Take 1/2 (one-half) tablet by mouth  twice daily    Dispense:  90 tablet    Refill:  3   tamsulosin (FLOMAX) 0.4 MG CAPS capsule    Sig: Take 1 capsule (0.4 mg total) by mouth daily.    Dispense:  90 capsule    Refill:  3     See below for relevant physical exam findings  See below for recent lab and imaging results reviewed  Medications, allergies, PMH, PSH, SocH, FamH reviewed below    Follow-up instructions: Return in about 6 months (around 04/20/2021) for [employer], SEE [place] SOONER IF NEEDED  .                                        Exam:  BP 118/68 (BP Location: Left Arm, Patient Position: Sitting, Cuff Size: Normal)   Pulse 67   Temp 98.3 F (36.8 C) (Oral)   Wt 177 lb 0.6 oz (80.3 kg)   BMI 27.73 kg/m  Constitutional: VS see above. General Appearance: alert, well-developed, well-nourished, NAD Neck: No masses, trachea midline.  Respiratory: Normal respiratory effort. no wheeze, no rhonchi, no rales Cardiovascular: S1/S2 normal, no murmur, no rub/gallop auscultated. RRR.  Musculoskeletal: Gait normal. Symmetric and independent movement of all extremities Neurological: Normal balance/coordination. No tremor. Skin: warm, dry, intact.  Psychiatric: Normal judgment/insight. Normal mood and affect. Oriented x3.   Current Meds  Medication Sig   AMBULATORY NON FORMULARY MEDICATION Single glucometer with lancets, test strips   AMBULATORY NON FORMULARY MEDICATION Supply ordered: CPAP and other supplies needed (headgear, cushions, filters, heated tuubing and water chamber) Dx: obstructive sleep apnea Settings: auto-titration 5-20 cmH2O   aspirin 81 MG tablet Take 81 mg by mouth daily.   blood glucose meter kit and supplies KIT Check morning fasting glucose daily and up to four times daily as needed   fexofenadine (ALLEGRA) 180 MG tablet Take 180 mg by mouth daily.   Levocetirizine Dihydrochloride (XYZAL PO) Take by mouth.   nitroGLYCERIN (NITROSTAT) 0.4 MG SL tablet Place 1 tablet (0.4 mg total) under the tongue every 5 (five) minutes as needed for chest pain (or tightness).   ONETOUCH ULTRA test strip CHECK MORNING FASTING GLUCOSE DAILY AND UP TO 4 TIMES DAILY AS NEEDED   pantoprazole (PROTONIX) 40 MG tablet Take 1 tablet (40 mg total) by mouth daily.   topiramate (TOPAMAX) 50 MG tablet One half tab by mouth daily for one week then one tab by mouth daily.   [DISCONTINUED] atorvastatin (LIPITOR) 80 MG tablet Take 80 mg by mouth daily.    [DISCONTINUED] betamethasone dipropionate 0.05 % cream Apply topically 2 (two) times daily. To affected area(s) as needed   [DISCONTINUED] diazepam (VALIUM) 5 MG tablet Take 1 tablet (5 mg total) by mouth every 8 (eight) hours as needed (vertigo/dizziness).   [DISCONTINUED] escitalopram (LEXAPRO) 5 MG tablet Take 1 tablet (5 mg total) by mouth daily.   [DISCONTINUED] FARXIGA 10 MG TABS tablet Take 1 tablet by mouth once daily   [DISCONTINUED] isosorbide mononitrate (ISMO) 20 MG tablet Take 20 mg by mouth 2 (two) times daily.   [DISCONTINUED] meclizine (ANTIVERT) 25 MG tablet Take 1 tablet (25 mg total) by mouth 3 (three) times daily as needed for dizziness.   [DISCONTINUED] metoprolol tartrate (LOPRESSOR) 25 MG tablet Take 1/2 (one-half) tablet by mouth twice daily   [DISCONTINUED] tamsulosin (FLOMAX) 0.4 MG CAPS capsule Take 1 capsule (0.4 mg total) by mouth  daily.    No Known Allergies  Patient Active Problem List   Diagnosis Date Noted   Coronary artery disease of native heart with stable angina pectoris ([place]) 10/19/2020   Benign prostatic hyperplasia with incomplete bladder emptying 01/27/2020   S/P CABG (coronary artery bypass graft) 02/17/2019   DDD (degenerative disc disease), cervical 01/29/2019   Intractable migraine without aura and without status migrainosus 12/25/2018   History of 2019 novel coronavirus disease (COVID-19) 12/04/2018   Tinea pedis of both feet 12/04/2018   Hypertension associated with diabetes ([place]) 12/04/2018   New daily persistent headache 12/04/2018   Controlled type 2 diabetes mellitus with diabetic dermatitis, without long-term current use of insulin ([place]) 12/04/2018   Pruritus ani 11/23/2018   Cough headache syndrome 11/23/2018   Respiratory tract infection due to COVID-19 virus 11/23/2018   Transaminitis 06/30/2018   Diabetic polyneuropathy associated with type 2 diabetes mellitus ([place]) 06/29/2018   Colon cancer screening 10/03/2017   Hyperlipidemia  associated with type 2 diabetes mellitus ([place]) 10/03/2017   Uncontrolled type 2 diabetes mellitus with hyperglycemia ([place]) 10/03/2017   Combined nonsenile cataract 01/09/2017   Dermatochalasis 01/09/2017   Posterior vitreous detachment of left eye 01/09/2017   Balanitis xerotica obliterans 09/20/2016   Phimosis 09/20/2016   Vitamin D insufficiency 05/07/2016   Hypertriglyceridemia 05/07/2016   Overweight (BMI 25.0-29.9) 05/06/2016   Hematuria 11/27/2015   Hyperglycemia 11/20/2015   Ischemic cardiomyopathy 11/20/2015   Exertional angina ([place]) 11/01/2015   GERD (gastroesophageal reflux disease) 07/10/2015   SNHL (sensory-neural hearing loss), asymmetrical 06/26/2015   Vertigo 06/12/2015   History of MI (myocardial infarction) 05/26/2015   Hyperlipidemia 05/26/2015   Elevated liver enzymes 05/26/2015   Allergic rhinitis 08/17/2012    Family History  Problem Relation Age of Onset   Diabetes Mother    Heart disease Mother    Kidney disease Mother     Social History   Tobacco Use  Smoking Status Never  Smokeless Tobacco Never    Past Surgical History:  Procedure Laterality Date   CORONARY/GRAFT ACUTE MI REVASCULARIZATION  2012    Immunization History  Administered Date(s) Administered   Influenza,inj,Quad PF,6+ Mos 02/26/2019   PFIZER(Purple Top)SARS-COV-2 Vaccination 09/06/2009, 08/16/2019   Pneumococcal Polysaccharide-23 10/03/2017   Tdap 11/25/2016    No results found for this or any previous visit (from the past 2160 hour(s)).  No results found.     All questions at time of visit were answered - patient instructed to contact office with any additional concerns or updates. ER/RTC precautions were reviewed with the patient as applicable.   Please note: manual typing as well as voice recognition software may have been used to produce this document - typos may escape review. Please contact Dr. Sheppard Coil for any needed clarifications.

## 2020-10-19 NOTE — Patient Instructions (Signed)
Refills sent Will send order for new CPAP Referral to podiatry for toenails  Blood work today

## 2020-10-20 LAB — CBC
HCT: 49.2 % (ref 38.5–50.0)
Hemoglobin: 16.6 g/dL (ref 13.2–17.1)
MCH: 30.2 pg (ref 27.0–33.0)
MCHC: 33.7 g/dL (ref 32.0–36.0)
MCV: 89.5 fL (ref 80.0–100.0)
MPV: 11 fL (ref 7.5–12.5)
Platelets: 248 10*3/uL (ref 140–400)
RBC: 5.5 10*6/uL (ref 4.20–5.80)
RDW: 13.1 % (ref 11.0–15.0)
WBC: 5.5 10*3/uL (ref 3.8–10.8)

## 2020-10-20 LAB — COMPLETE METABOLIC PANEL WITH GFR
AG Ratio: 1.2 (calc) (ref 1.0–2.5)
ALT: 52 U/L — ABNORMAL HIGH (ref 9–46)
AST: 36 U/L — ABNORMAL HIGH (ref 10–35)
Albumin: 4.3 g/dL (ref 3.6–5.1)
Alkaline phosphatase (APISO): 98 U/L (ref 35–144)
BUN: 16 mg/dL (ref 7–25)
CO2: 27 mmol/L (ref 20–32)
Calcium: 9.8 mg/dL (ref 8.6–10.3)
Chloride: 102 mmol/L (ref 98–110)
Creat: 0.95 mg/dL (ref 0.70–1.33)
GFR, Est African American: 102 mL/min/{1.73_m2} (ref >=60)
GFR, Est Non African American: 88 mL/min/{1.73_m2} (ref >=60)
Globulin: 3.5 g/dL (calc) (ref 1.9–3.7)
Glucose, Bld: 182 mg/dL — ABNORMAL HIGH (ref 65–99)
Potassium: 4.2 mmol/L (ref 3.5–5.3)
Sodium: 138 mmol/L (ref 135–146)
Total Bilirubin: 0.5 mg/dL (ref 0.2–1.2)
Total Protein: 7.8 g/dL (ref 6.1–8.1)

## 2020-10-20 LAB — LIPID PANEL
Cholesterol: 269 mg/dL — ABNORMAL HIGH (ref <200)
HDL: 38 mg/dL — ABNORMAL LOW (ref >=40)
Non-HDL Cholesterol (Calc): 231 mg/dL (calc) — ABNORMAL HIGH (ref <130)
Total CHOL/HDL Ratio: 7.1 (calc) — ABNORMAL HIGH (ref <5.0)
Triglycerides: 545 mg/dL — ABNORMAL HIGH (ref <150)

## 2020-10-20 LAB — MICROALBUMIN / CREATININE URINE RATIO
Creatinine, Urine: 41 mg/dL (ref 20–320)
Microalb, Ur: <0.2 mg/dL

## 2020-10-20 LAB — PSA, TOTAL WITH REFLEX TO PSA, FREE: PSA, Total: 0.3 ng/mL (ref <=4.0)

## 2020-10-20 LAB — HEMOGLOBIN A1C
Hgb A1c MFr Bld: 7 % of total Hgb — ABNORMAL HIGH (ref <5.7)
Mean Plasma Glucose: 154 mg/dL
eAG (mmol/L): 8.5 mmol/L

## 2020-10-20 MED ORDER — ICOSAPENT ETHYL 1 G PO CAPS: 2.0000 g | ORAL_CAPSULE | Freq: Two times a day (BID) | ORAL | 3 refills | Status: DC

## 2020-10-20 NOTE — Addendum Note (Signed)
Addended by: Deirdre Pippins on: 10/20/2020 08:53 AM   Modules accepted: Orders

## 2020-11-15 ENCOUNTER — Telehealth: Payer: Self-pay | Admitting: Neurology

## 2020-11-15 NOTE — Telephone Encounter (Signed)
Prior Authorization for [employer] submitted via covermymeds.  PA Case: [id number], Status: Approved, Coverage Starts on: 11/15/2020 12:00:00 AM, Coverage Ends on: 11/15/2021 12:00:00 AM.

## 2020-11-24 ENCOUNTER — Ambulatory Visit: Payer: Commercial Managed Care - PPO | Admitting: Podiatry

## 2020-12-27 ENCOUNTER — Encounter: Payer: Self-pay | Admitting: Family Medicine

## 2021-01-04 IMAGING — DX DG CERVICAL SPINE COMPLETE 4+V
6 series · 6 of 6 positions shown · non-contrast
Comparison: None.

CLINICAL DATA: Left cervical spine pain, left shoulder pain, no
injury

EXAM:
CERVICAL SPINE - COMPLETE 4+ VIEW

[c-spine lat]
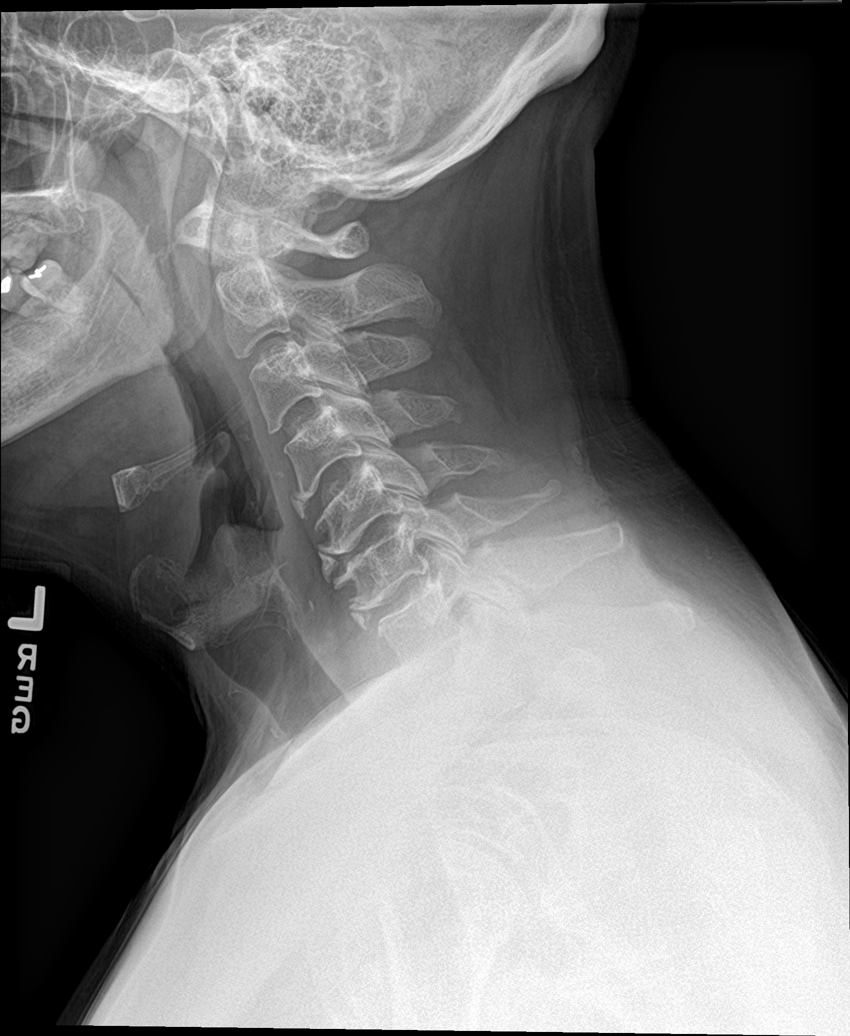

[c-spine obl (1 of 2)]
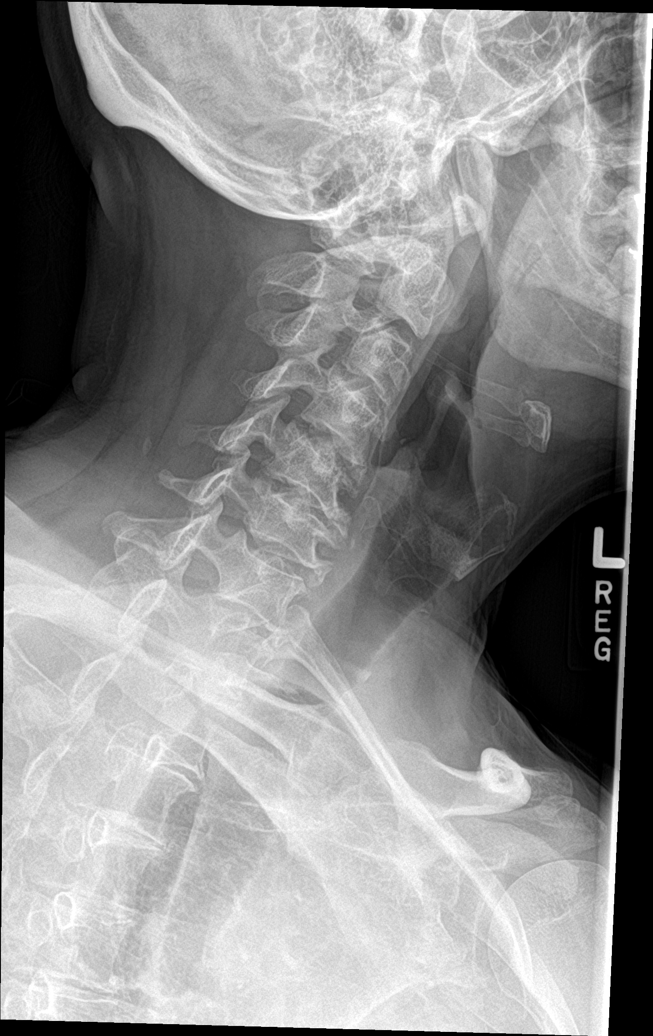

[c-spine obl (2 of 2)]
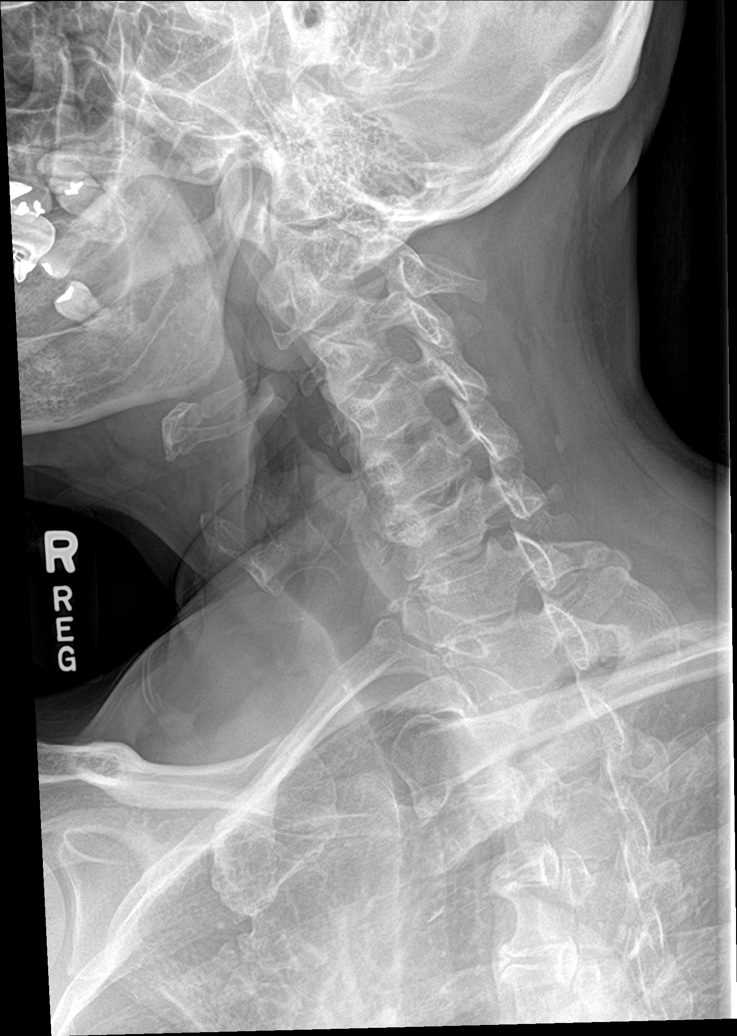

[c-spine ap]
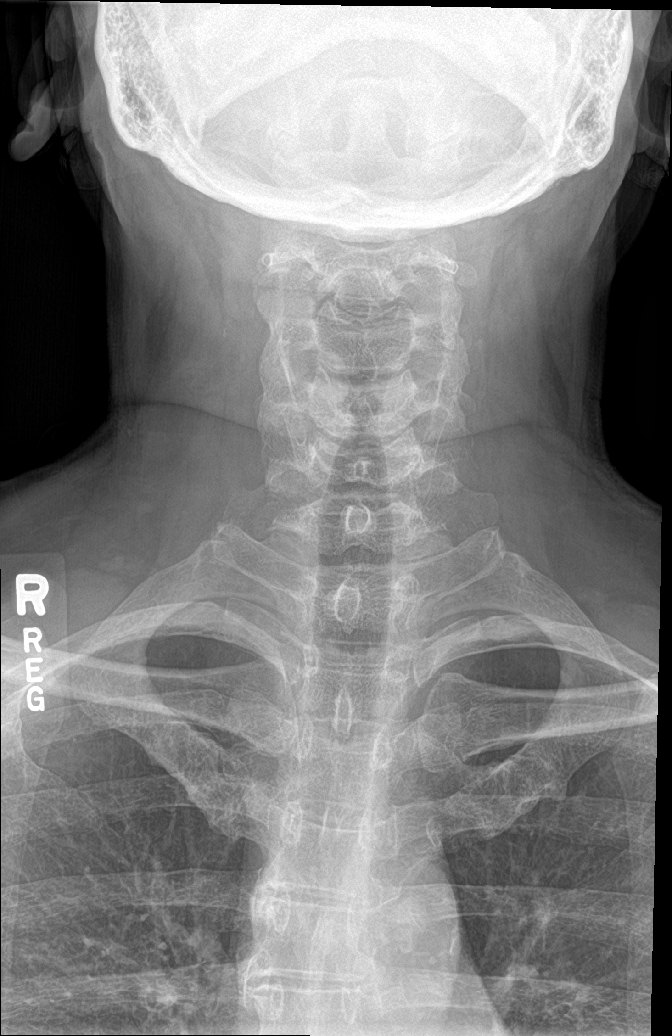

[c-spine open mouth]
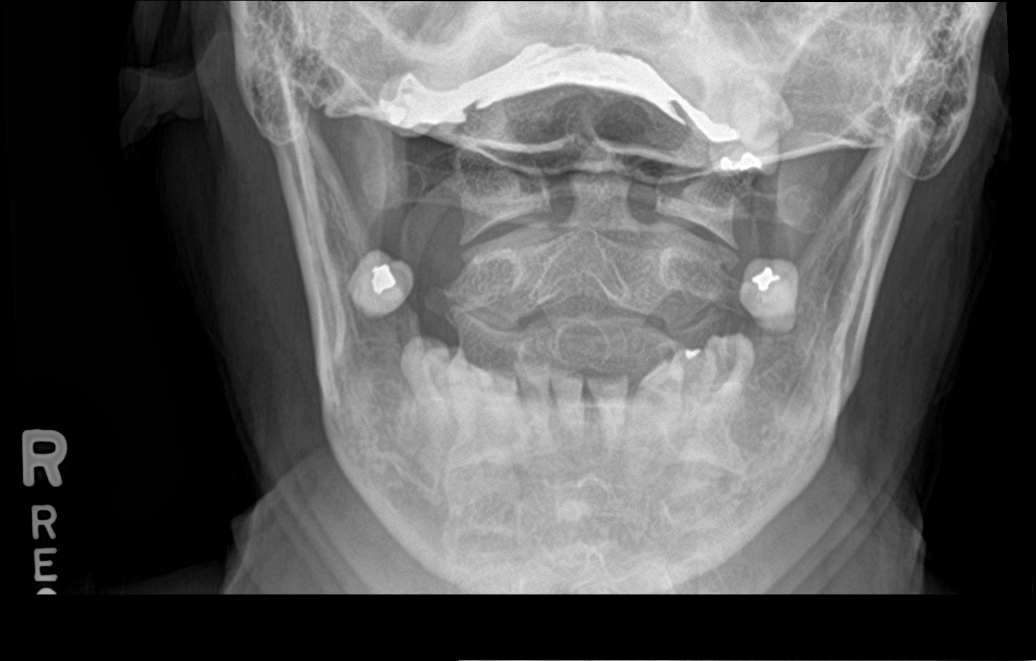

[c-spine swimmers]
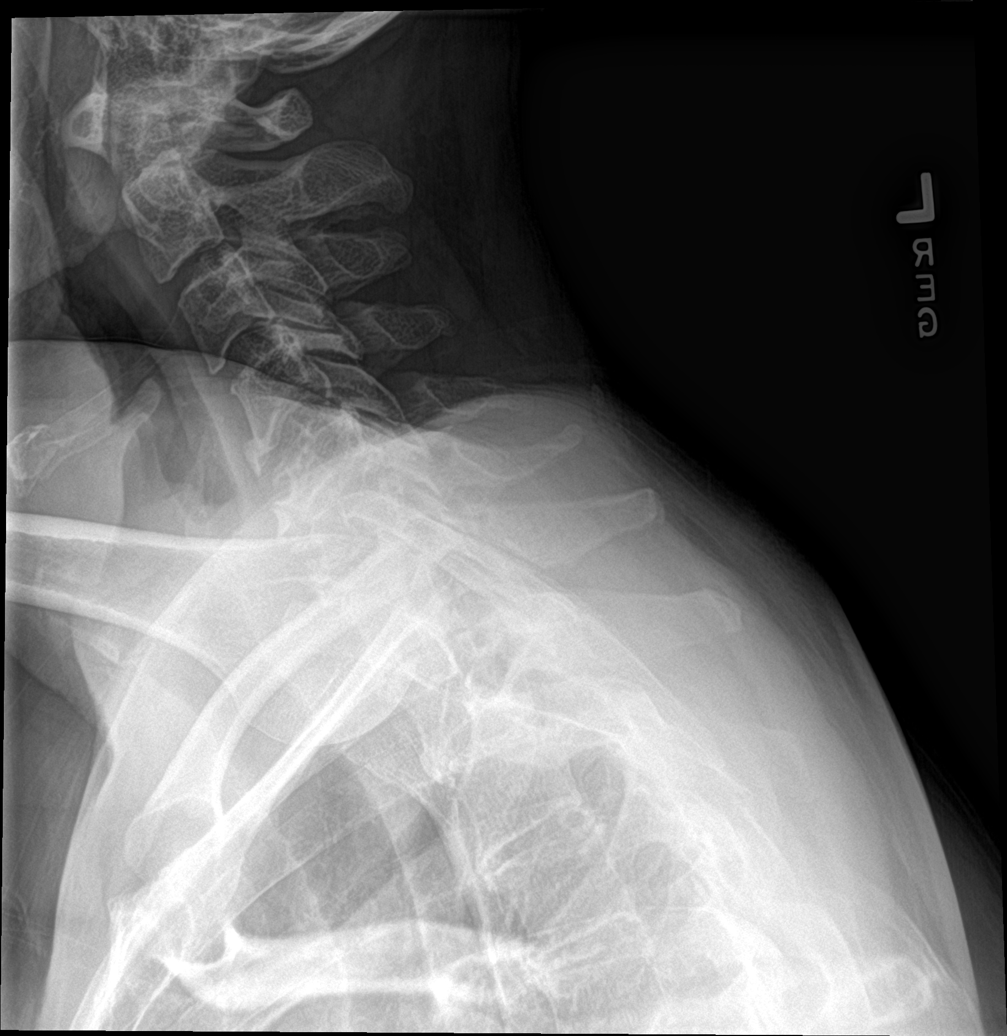

[6 of 6 positions shown; findings below may reference images not displayed]

FINDINGS: No fracture or static subluxation of the cervical spine. There is
moderate multilevel disc space height loss and osteophytosis of the
cervical spine from C4 through C7. There is mild bony neural
foraminal stenosis at C5-C6 and C6-C7 on the left and C5-C6 on the
right. The partially imaged skull base, cervical soft tissues, and
lung apices are unremarkable.
IMPRESSION: No fracture or static subluxation of the cervical spine. There is
moderate multilevel disc space height loss and osteophytosis of the
cervical spine from C4 through C7. There is mild bony neural
foraminal stenosis at C5-C6 and C6-C7 on the left and C5-C6 on the
right. Cervical disc and neural foraminal pathology may be further
evaluated by MRI if indicated by localizing signs and symptoms.

## 2021-03-30 DIAGNOSIS — Z6828 Body mass index (BMI) 28.0-28.9, adult: Secondary | ICD-10-CM | POA: Diagnosis not present

## 2021-03-30 DIAGNOSIS — Z13220 Encounter for screening for lipoid disorders: Secondary | ICD-10-CM | POA: Diagnosis not present

## 2021-03-30 DIAGNOSIS — Z713 Dietary counseling and surveillance: Secondary | ICD-10-CM | POA: Diagnosis not present

## 2021-03-30 DIAGNOSIS — Z131 Encounter for screening for diabetes mellitus: Secondary | ICD-10-CM | POA: Diagnosis not present

## 2021-03-30 DIAGNOSIS — Z136 Encounter for screening for cardiovascular disorders: Secondary | ICD-10-CM | POA: Diagnosis not present

## 2021-03-30 DIAGNOSIS — R030 Elevated blood-pressure reading, without diagnosis of hypertension: Secondary | ICD-10-CM | POA: Diagnosis not present

## 2021-05-09 DIAGNOSIS — I208 Other forms of angina pectoris: Secondary | ICD-10-CM | POA: Diagnosis not present

## 2021-05-09 DIAGNOSIS — I259 Chronic ischemic heart disease, unspecified: Secondary | ICD-10-CM | POA: Diagnosis not present

## 2021-05-09 DIAGNOSIS — Z951 Presence of aortocoronary bypass graft: Secondary | ICD-10-CM | POA: Diagnosis not present

## 2021-05-09 DIAGNOSIS — R9431 Abnormal electrocardiogram [ECG] [EKG]: Secondary | ICD-10-CM | POA: Diagnosis not present

## 2021-05-09 DIAGNOSIS — Z7982 Long term (current) use of aspirin: Secondary | ICD-10-CM | POA: Diagnosis not present

## 2021-05-09 DIAGNOSIS — I25811 Atherosclerosis of native coronary artery of transplanted heart without angina pectoris: Secondary | ICD-10-CM | POA: Diagnosis not present

## 2021-05-09 DIAGNOSIS — I2510 Atherosclerotic heart disease of native coronary artery without angina pectoris: Secondary | ICD-10-CM | POA: Diagnosis not present

## 2021-05-09 DIAGNOSIS — Z79899 Other long term (current) drug therapy: Secondary | ICD-10-CM | POA: Diagnosis not present

## 2022-04-26 ENCOUNTER — Ambulatory Visit
Admission: EM | Admit: 2022-04-26 | Discharge: 2022-04-26 | Disposition: A | Discharge status: Home / Self Care | Payer: BC Managed Care – PPO | Attending: Family Medicine | Admitting: Family Medicine

## 2022-04-26 DIAGNOSIS — J0100 Acute maxillary sinusitis, unspecified: Secondary | ICD-10-CM

## 2022-04-26 DIAGNOSIS — J309 Allergic rhinitis, unspecified: Secondary | ICD-10-CM

## 2022-04-26 MED ORDER — AMOXICILLIN 875 MG PO TABS: 875.0000 mg | ORAL_TABLET | Freq: Two times a day (BID) | ORAL | 0 refills | Status: AC

## 2022-04-26 MED ORDER — FEXOFENADINE HCL 180 MG PO TABS: 180.0000 mg | ORAL_TABLET | Freq: Every day | ORAL | 0 refills | Status: DC

## 2022-04-26 NOTE — ED Provider Notes (Signed)
[facility]    CSN: [id number] Arrival date & time: 04/26/22  1104      History   Chief Complaint Chief Complaint  Patient presents with   Letter for School/Work   Headache   Sore Throat   Nasal Congestion    HPI Zachary Harvey is a 61 y.o. male.   HPI 61 year old male presents with headache, sore throat and nasal congestion since Monday of this week or 5 days.  Patient reports that he needs a note to return to work on Monday as required by his employer.  PMH significant for CAD, chronic ischemic heart disease, T2DM, and HLD. Patient is accompanied by his wife this afternoon.  Past Medical History:  Diagnosis Date   Allergy    BPPV (benign paroxysmal positional vertigo)    CAD (coronary artery disease)    Combined nonsenile cataract 01/09/2017   Dermatochalasis 01/09/2017   Observation per Dr. Larose Kells   Diabetes mellitus without complication ([place])    GERD (gastroesophageal reflux disease)    Hyperlipidemia    Hypertension    Myocardial infarction [facility])    Posterior vitreous detachment of left eye 01/09/2017   Transaminitis 06/30/2018    Patient Active Problem List   Diagnosis Date Noted   Coronary artery disease of native heart with stable angina pectoris ([place]) 10/19/2020   Benign prostatic hyperplasia with incomplete bladder emptying 01/27/2020   S/P CABG (coronary artery bypass graft) 02/17/2019   DDD (degenerative disc disease), cervical 01/29/2019   Intractable migraine without aura and without status migrainosus 12/25/2018   History of 2019 novel coronavirus disease (COVID-19) 12/04/2018   Tinea pedis of both feet 12/04/2018   Hypertension associated with diabetes ([place]) 12/04/2018   New daily persistent headache 12/04/2018   Controlled type 2 diabetes mellitus with diabetic dermatitis, without long-term current use of insulin ([place]) 12/04/2018   Pruritus ani 11/23/2018   Cough headache syndrome 11/23/2018   Respiratory tract infection due to  COVID-19 virus 11/23/2018   Transaminitis 06/30/2018   Diabetic polyneuropathy associated with type 2 diabetes mellitus ([place]) 06/29/2018   Colon cancer screening 10/03/2017   Hyperlipidemia associated with type 2 diabetes mellitus ([place]) 10/03/2017   Uncontrolled type 2 diabetes mellitus with hyperglycemia ([place]) 10/03/2017   Combined nonsenile cataract 01/09/2017   Dermatochalasis 01/09/2017   Posterior vitreous detachment of left eye 01/09/2017   Balanitis xerotica obliterans 09/20/2016   Phimosis 09/20/2016   Vitamin D insufficiency 05/07/2016   Hypertriglyceridemia 05/07/2016   Overweight (BMI 25.0-29.9) 05/06/2016   Hematuria 11/27/2015   Hyperglycemia 11/20/2015   Ischemic cardiomyopathy 11/20/2015   Exertional angina 11/01/2015   GERD (gastroesophageal reflux disease) 07/10/2015   SNHL (sensory-neural hearing loss), asymmetrical 06/26/2015   Vertigo 06/12/2015   History of MI (myocardial infarction) 05/26/2015   Hyperlipidemia 05/26/2015   Elevated liver enzymes 05/26/2015   Allergic rhinitis 08/17/2012    Past Surgical History:  Procedure Laterality Date   CORONARY/GRAFT ACUTE MI REVASCULARIZATION  2012       Home Medications    Prior to Admission medications   Medication Sig Start Date End Date Taking? Authorizing Provider  amoxicillin (AMOXIL) 875 MG tablet Take 1 tablet (875 mg total) by mouth 2 (two) times daily for 7 days. 04/26/22 05/03/22 Yes Eliezer Lofts, FNP  fexofenadine [facility] ALLERGY) 180 MG tablet Take 1 tablet (180 mg total) by mouth daily for 15 days. 04/26/22 05/11/22 Yes Eliezer Lofts, FNP  AMBULATORY NON FORMULARY MEDICATION Single glucometer with lancets, test strips 09/20/16   Maisie Fus,  [place], PA-C  AMBULATORY NON FORMULARY MEDICATION Supply ordered: CPAP and other supplies needed (headgear, cushions, filters, heated tuubing and water chamber) Dx: obstructive sleep apnea Settings: auto-titration 5-20 cmH2O 10/19/20   Emeterio Reeve, DO  aspirin 81 MG tablet Take 81 mg by mouth daily.    [provider]  atorvastatin (LIPITOR) 80 MG tablet Take 1 tablet (80 mg total) by mouth daily. 10/19/20   Emeterio Reeve, DO  betamethasone dipropionate 0.05 % cream Apply topically 2 (two) times daily. To affected area(s) as needed 10/19/20   Emeterio Reeve, DO  blood glucose meter kit and supplies KIT Check morning fasting glucose daily and up to four times daily as needed 12/04/18   Trixie Dredge, PA-C  dapagliflozin propanediol (FARXIGA) 10 MG TABS tablet Take 1 tablet (10 mg total) by mouth daily. 10/19/20   Emeterio Reeve, DO  escitalopram (LEXAPRO) 5 MG tablet Take 1 tablet (5 mg total) by mouth daily. 10/19/20   Emeterio Reeve, DO  icosapent Ethyl (VASCEPA) 1 g capsule Take 2 capsules (2 g total) by mouth 2 (two) times daily. 10/20/20 01/18/21  Emeterio Reeve, DO  isosorbide mononitrate (ISMO) 20 MG tablet Take 1 tablet (20 mg total) by mouth 2 (two) times daily. 10/19/20   Emeterio Reeve, DO  Levocetirizine Dihydrochloride (XYZAL PO) Take by mouth.    [provider]  metoprolol tartrate (LOPRESSOR) 25 MG tablet Take 1/2 (one-half) tablet by mouth twice daily 10/19/20   Emeterio Reeve, DO  nitroGLYCERIN (NITROSTAT) 0.4 MG SL tablet Place 1 tablet (0.4 mg total) under the tongue every 5 (five) minutes as needed for chest pain (or tightness). 04/19/20   Emeterio Reeve, DO  ONETOUCH ULTRA test strip CHECK MORNING FASTING GLUCOSE DAILY AND UP TO 4 TIMES DAILY AS NEEDED 03/03/19   Breeback, Jade L, PA-C  pantoprazole (PROTONIX) 40 MG tablet Take 1 tablet (40 mg total) by mouth daily. 04/26/20   Emeterio Reeve, DO  tamsulosin (FLOMAX) 0.4 MG CAPS capsule Take 1 capsule (0.4 mg total) by mouth daily. 10/19/20   Emeterio Reeve, DO  topiramate (TOPAMAX) 50 MG tablet One half tab by mouth daily for one week then one tab by mouth daily. 02/28/20   Silverio Decamp, MD     Family History Family History  Problem Relation Age of Onset   Diabetes Mother    Heart disease Mother    Kidney disease Mother     Social History Social History   Tobacco Use   Smoking status: Never   Smokeless tobacco: Never  Substance Use Topics   Alcohol use: No   Drug use: No     Allergies   Patient has no known allergies.   Review of Systems Review of Systems  HENT:  Positive for congestion, postnasal drip and rhinorrhea.   All other systems reviewed and are negative.    Physical Exam Triage Vital Signs ED Triage Vitals  Enc Vitals Group     BP 04/26/22 1240 100/66     Pulse Rate 04/26/22 1240 91     Resp 04/26/22 1240 16     Temp 04/26/22 1240 98.7 F (37.1 C)     Temp Source 04/26/22 1240 Oral     SpO2 04/26/22 1240 99 %     Weight --      Height --      Head Circumference --      Peak Flow --      Pain Score 04/26/22 1239 0  Pain Loc --      Pain Edu? --      Excl. in [place]? --    No data found.  Updated Vital Signs BP 100/66 (BP Location: Left Arm)   Pulse 91   Temp 98.7 F (37.1 C) (Oral)   Resp 16   SpO2 99%   Visual Acuity Right Eye Distance:   Left Eye Distance:   Bilateral Distance:    Right Eye Near:   Left Eye Near:    Bilateral Near:     Physical Exam Vitals and nursing note reviewed.  Constitutional:      Appearance: Normal appearance. He is normal weight.  HENT:     Head: Normocephalic and atraumatic.     Right Ear: Tympanic membrane and external ear normal.     Left Ear: Tympanic membrane and external ear normal.     Ears:     Comments: Moderate to significant eustachian tube dysfunction noted bilaterally    Mouth/Throat:     Mouth: Mucous membranes are moist.     Pharynx: Oropharynx is clear.     Comments: Significant amount of clear drainage of posterior oropharynx noted Eyes:     Extraocular Movements: Extraocular movements intact.     Conjunctiva/sclera: Conjunctivae normal.     Pupils: Pupils are  equal, round, and reactive to light.  Cardiovascular:     Rate and Rhythm: Normal rate and regular rhythm.     Pulses: Normal pulses.     Heart sounds: Normal heart sounds. No murmur heard.    No friction rub. No gallop.  Pulmonary:     Effort: Pulmonary effort is normal.     Breath sounds: Normal breath sounds. No wheezing or rhonchi.  Musculoskeletal:        General: Normal range of motion.     Cervical back: Normal range of motion and neck supple. No tenderness.  Lymphadenopathy:     Cervical: No cervical adenopathy.  Skin:    General: Skin is warm and dry.  Neurological:     General: No focal deficit present.     Mental Status: He is alert. Mental status is at baseline.      UC Treatments / Results  Labs (all labs ordered are listed, but only abnormal results are displayed) Labs Reviewed - No data to display  EKG   Radiology No results found.  Procedures Procedures (including critical care time)  Medications Ordered in UC Medications - No data to display  Initial Impression / Assessment and Plan / UC Course  I have reviewed the triage vital signs and the nursing notes.  Pertinent labs & imaging results that were available during my care of the patient were reviewed by me and considered in my medical decision making (see chart for details).     MDM: 1.  Acute maxillary sinusitis, recurrence not specified-Rx'd amoxicillin; 2.  Allergic rhinitis-Rx'd Allegra. Instructed patient to take medication as directed with food to completion.  Advised patient to take Allegra with first dose of amoxicillin for the next 5 of 7 days.  Advised may use Allegra as needed afterwards for concurrent postnasal drainage/drip.  Encouraged patient increase daily water intake while taking these medications.  Advised if symptoms worsen and/or unresolved please follow-up with PCP or here for further evaluation.  Work note provided per patient request.  Patient discharged home, hemodynamically  stable. Final Clinical Impressions(s) / UC Diagnoses   Final diagnoses:  Acute maxillary sinusitis, recurrence not specified  Allergic rhinitis, unspecified  seasonality, unspecified trigger     Discharge Instructions      Instructed patient to take medication as directed with food to completion.  Advised patient to take Allegra with first dose of amoxicillin for the next 5 of 7 days.  Advised may use Allegra as needed afterwards for concurrent postnasal drainage/drip.  Encouraged patient increase daily water intake while taking these medications.  Advised if symptoms worsen and/or unresolved please follow-up with PCP or here for further evaluation.     ED Prescriptions     Medication Sig Dispense Auth. Provider   amoxicillin (AMOXIL) 875 MG tablet Take 1 tablet (875 mg total) by mouth 2 (two) times daily for 7 days. 14 tablet Eliezer Lofts, FNP   fexofenadine [facility] ALLERGY) 180 MG tablet Take 1 tablet (180 mg total) by mouth daily for 15 days. 15 tablet Eliezer Lofts, FNP      PDMP not reviewed this encounter.   Eliezer Lofts, [place] 04/26/22 1307

## 2022-04-26 NOTE — Discharge Instructions (Addendum)
Instructed patient to take medication as directed with food to completion.  Advised patient to take Allegra with first dose of amoxicillin for the next 5 of 7 days.  Advised may use Allegra as needed afterwards for concurrent postnasal drainage/drip.  Encouraged patient increase daily water intake while taking these medications.  Advised if symptoms worsen and/or unresolved please follow-up with PCP or here for further evaluation.

## 2022-04-26 NOTE — ED Triage Notes (Signed)
Patient presents to UC for work note. C/o HA, sore throat, and congestion since Monday. Treating with mucinex, tylenol, and ibuprofen.  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

## 2022-04-30 ENCOUNTER — Ambulatory Visit: Payer: BC Managed Care – PPO | Admitting: Family Medicine

## 2022-05-20 ENCOUNTER — Encounter: Payer: Self-pay | Admitting: Family Medicine

## 2022-05-20 ENCOUNTER — Ambulatory Visit: Payer: BC Managed Care – PPO | Admitting: Family Medicine

## 2022-05-20 VITALS — BP 124/83 | HR 68 | Temp 97.3°F | Ht 67.0 in | Wt 156.5 lb

## 2022-05-20 DIAGNOSIS — B351 Tinea unguium: Secondary | ICD-10-CM | POA: Diagnosis not present

## 2022-05-20 DIAGNOSIS — Z7689 Persons encountering health services in other specified circumstances: Secondary | ICD-10-CM

## 2022-05-20 DIAGNOSIS — E119 Type 2 diabetes mellitus without complications: Secondary | ICD-10-CM | POA: Diagnosis not present

## 2022-05-20 DIAGNOSIS — L602 Onychogryphosis: Secondary | ICD-10-CM | POA: Diagnosis not present

## 2022-05-20 DIAGNOSIS — G4733 Obstructive sleep apnea (adult) (pediatric): Secondary | ICD-10-CM | POA: Diagnosis not present

## 2022-05-20 DIAGNOSIS — I25118 Atherosclerotic heart disease of native coronary artery with other forms of angina pectoris: Secondary | ICD-10-CM

## 2022-05-20 NOTE — Progress Notes (Signed)
New Patient Office Visit  Subjective    Patient ID: Zachary Harvey, male    DOB: 1960-07-06  Age: 62 y.o. MRN: [id number]  CC:  Chief Complaint  Patient presents with   Establish Care   Referral    Pulmonology; Needs to renew CPAP machine   Nail Problem    Bilateral feet- noticed 6 months ago.    HPI Zachary Harvey presents to establish care  Pt reports he has OSA since 2006. He says he use to get his supplies through [facility]. He says he's had his CPAP machine for the last 4 years. He says he needs rx for CPAP and materials to go to [facility]. He uses nasal pillow but this time he would like the full nasal mask. He has auto Cpap settings.   Pt has Diabetes. He checks his sugars once a month. He uses Ozempic 0.5mg  once a week and Farxiga 10 mg daily. He had diarrhea with Metformin. He has also had fungus on his toenails and very thick. He is unable to cut is toenails. Would like to see podiatrist.  He has hx of CAD and CABG in 2020. Seeing cardiologist. Taking Metoprolol, Imdur, Atorvastatin, and Aspirin 81mg  daily.   Outpatient Encounter Medications as of 05/20/2022  Medication Sig   AMBULATORY NON FORMULARY MEDICATION Single glucometer with lancets, test strips   AMBULATORY NON FORMULARY MEDICATION Supply ordered: CPAP and other supplies needed (headgear, cushions, filters, heated tuubing and water chamber) Dx: obstructive sleep apnea Settings: auto-titration 5-20 cmH2O   aspirin 81 MG tablet Take 81 mg by mouth daily.   atorvastatin (LIPITOR) 80 MG tablet Take 1 tablet (80 mg total) by mouth daily.   blood glucose meter kit and supplies KIT Check morning fasting glucose daily and up to four times daily as needed   dapagliflozin propanediol (FARXIGA) 10 MG TABS tablet Take 1 tablet (10 mg total) by mouth daily.   escitalopram (LEXAPRO) 5 MG tablet Take 1 tablet (5 mg total) by mouth daily.   Levocetirizine Dihydrochloride (XYZAL PO) Take by mouth.   metoprolol  succinate (TOPROL-XL) 25 MG 24 hr tablet Take 25 mg by mouth daily.   ONETOUCH ULTRA test strip CHECK MORNING FASTING GLUCOSE DAILY AND UP TO 4 TIMES DAILY AS NEEDED   OZEMPIC, 0.25 OR 0.5 MG/DOSE, 2 MG/3ML SOPN Inject 0.5 mg into the skin once a week.   [DISCONTINUED] pantoprazole (PROTONIX) 40 MG tablet Take 1 tablet (40 mg total) by mouth daily.   [DISCONTINUED] tamsulosin (FLOMAX) 0.4 MG CAPS capsule Take 1 capsule (0.4 mg total) by mouth daily.   [DISCONTINUED] topiramate (TOPAMAX) 50 MG tablet One half tab by mouth daily for one week then one tab by mouth daily.   isosorbide mononitrate (ISMO) 20 MG tablet Take 1 tablet (20 mg total) by mouth 2 (two) times daily.   [DISCONTINUED] betamethasone dipropionate 0.05 % cream Apply topically 2 (two) times daily. To affected area(s) as needed (Patient not taking: Reported on 05/20/2022)   [DISCONTINUED] ezetimibe (ZETIA) 10 MG tablet Take 1 tablet by mouth daily. (Patient not taking: Reported on 05/20/2022)   [DISCONTINUED] fexofenadine (ALLEGRA ALLERGY) 180 MG tablet Take 1 tablet (180 mg total) by mouth daily for 15 days.   [DISCONTINUED] icosapent Ethyl (VASCEPA) 1 g capsule Take 2 capsules (2 g total) by mouth 2 (two) times daily.   [DISCONTINUED] lidocaine (LMX) 4 % cream Apply to chest wall (Patient not taking: Reported on 05/20/2022)   [DISCONTINUED] metoprolol tartrate (  LOPRESSOR) 25 MG tablet Take 1/2 (one-half) tablet by mouth twice daily (Patient not taking: Reported on 05/20/2022)   [DISCONTINUED] nitroGLYCERIN (NITROSTAT) 0.4 MG SL tablet Place 1 tablet (0.4 mg total) under the tongue every 5 (five) minutes as needed for chest pain (or tightness). (Patient not taking: Reported on 05/20/2022)   No facility-administered encounter medications on file as of 05/20/2022.    Past Medical History:  Diagnosis Date   Allergy    BPPV (benign paroxysmal positional vertigo)    CAD (coronary artery disease)    Combined nonsenile cataract 01/09/2017    Dermatochalasis 01/09/2017   Observation per Dr. Larose Kells   Diabetes mellitus without complication (HCC)    GERD (gastroesophageal reflux disease)    Hyperlipidemia    Hypertension    Myocardial infarction [facility])    Posterior vitreous detachment of left eye 01/09/2017   Transaminitis 06/30/2018    Past Surgical History:  Procedure Laterality Date   CORONARY ARTERY BYPASS GRAFT  2020   CORONARY/GRAFT ACUTE MI REVASCULARIZATION  2012    Family History  Problem Relation Age of Onset   Diabetes Mother    Heart disease Mother    Kidney disease Mother     Social History   Socioeconomic History   Marital status: Married    Spouse name: Not on file   Number of children: Not on file   Years of education: Not on file   Highest education level: Not on file  Occupational History   Not on file  Tobacco Use   Smoking status: Never   Smokeless tobacco: Never  Substance and Sexual Activity   Alcohol use: No   Drug use: No   Sexual activity: Yes    Birth control/protection: None  Other Topics Concern   Not on file  Social History Narrative   Not on file   Social Determinants of Health   Financial Resource Strain: Not on file  Food Insecurity: Not on file  Transportation Needs: Not on file  Physical Activity: Not on file  Stress: Not on file  Social Connections: Not on file  Intimate Partner Violence: Not on file    Review of Systems  All other systems reviewed and are negative.       Objective    BP 124/83   Pulse 68   Temp (!) 97.3 F (36.3 C) (Temporal)   Ht 5\' 7"  (1.702 m)   Wt 156 lb 8 oz (71 kg)   SpO2 99%   BMI 24.51 kg/m   Physical Exam Vitals and nursing note reviewed.  Constitutional:      Appearance: Normal appearance. He is normal weight.  HENT:     Head: Normocephalic and atraumatic.     Right Ear: External ear normal.     Left Ear: External ear normal.     Nose: Nose normal.     Mouth/Throat:     Mouth: Mucous membranes are moist.  Eyes:      Extraocular Movements: Extraocular movements intact.  Cardiovascular:     Rate and Rhythm: Normal rate and regular rhythm.     Pulses: Normal pulses.     Heart sounds: Normal heart sounds.  Pulmonary:     Effort: Pulmonary effort is normal.     Breath sounds: Normal breath sounds.  Skin:    General: Skin is warm.     Capillary Refill: Capillary refill takes less than 2 seconds.     Comments: Hyperpigmented thick bilateral toenails  Neurological:  General: No focal deficit present.     Mental Status: He is alert and oriented to person, place, and time. Mental status is at baseline.  Psychiatric:        Mood and Affect: Mood normal.        Behavior: Behavior normal.        Thought Content: Thought content normal.        Judgment: Judgment normal.        Assessment & Plan:   Problem List Items Addressed This Visit       Cardiovascular and Mediastinum   Coronary artery disease of native heart with stable angina pectoris (HCC)   Relevant Medications   metoprolol succinate (TOPROL-XL) 25 MG 24 hr tablet     Respiratory   OSA (obstructive sleep apnea) (Chronic)   Relevant Orders   For home use only DME continuous positive airway pressure (CPAP)     Endocrine   RESOLVED: Uncontrolled type 2 diabetes mellitus with hyperglycemia (HCC)   Relevant Medications   OZEMPIC, 0.25 OR 0.5 MG/DOSE, 2 MG/3ML SOPN   Other Visit Diagnoses     Encounter to establish care with new doctor    -  Primary   Onychomycosis       Relevant Orders   Ambulatory referral to Podiatry   Hypertrophic toenail       Relevant Orders   Ambulatory referral to Podiatry     Pt to return in 4 weeks for annual and labs, defers today Refer to podiatry for toenail clipping Sent with CPAP order to take to [facility]  Return in about 4 weeks (around 06/17/2022) for Annual Physical.   Leeanne Rio, MD

## 2022-05-27 ENCOUNTER — Ambulatory Visit: Payer: BC Managed Care – PPO | Admitting: Podiatry

## 2022-05-27 ENCOUNTER — Encounter: Payer: Self-pay | Admitting: Podiatry

## 2022-05-27 VITALS — BP 108/71 | HR 72

## 2022-05-27 DIAGNOSIS — B351 Tinea unguium: Secondary | ICD-10-CM | POA: Diagnosis not present

## 2022-05-27 DIAGNOSIS — E1142 Type 2 diabetes mellitus with diabetic polyneuropathy: Secondary | ICD-10-CM

## 2022-05-27 DIAGNOSIS — L603 Nail dystrophy: Secondary | ICD-10-CM | POA: Diagnosis not present

## 2022-05-27 NOTE — Addendum Note (Signed)
Addended by: Isidore Moos A on: 05/27/2022 11:53 AM   Modules accepted: Orders

## 2022-05-27 NOTE — Progress Notes (Signed)
  Subjective:  Patient ID: Zachary Harvey, male    DOB: 1960-11-15,   MRN: [id number]  Chief Complaint  Patient presents with   Nail Problem    Bilateral nail fungus, started 6 month ago, nails are thick and yellow,     62 y.o. male presents for concern of bilateral thickened toenails and fungus that started about 6 months ago. Relates the nails are difficult to trim and has tried OTC medications with no change. He is diabetic and Last A1c was 7.2 Does relates some burning and tingling in his feet.  Denies any other pedal complaints. Denies n/v/f/c.   Past Medical History:  Diagnosis Date   Allergy    BPPV (benign paroxysmal positional vertigo)    CAD (coronary artery disease)    Combined nonsenile cataract 01/09/2017   Dermatochalasis 01/09/2017   Observation per Dr. Larose Kells   Diabetes mellitus without complication [facility])    GERD (gastroesophageal reflux disease)    Hyperlipidemia    Hypertension    Myocardial infarction [facility])    Posterior vitreous detachment of left eye 01/09/2017   Transaminitis 06/30/2018    Objective:  Physical Exam: Vascular: DP/PT pulses 2/4 bilateral. CFT <3 seconds. Absent hair growth on digits. Edema noted to bilateral lower extremities. Xerosis noted bilaterally.  Skin. No lacerations or abrasions bilateral feet. Nails 1-5 bilateral  are thickened discolored and elongated with subungual debris.  Musculoskeletal: MMT 5/5 bilateral lower extremities in DF, PF, Inversion and Eversion. Deceased ROM in DF of ankle joint.  Neurological: Sensation intact to light touch. Protective sensation slighlty diminished bilateral.    Assessment:   1. Onychomycosis   2. Type 2 diabetes mellitus with diabetic polyneuropathy, unspecified whether long term insulin use ([place])      Plan:  Patient was evaluated and treated and all questions answered. -Examined patient -Discussed treatment options for painful dystrophic nails  -Clinical picture and Fungal culture was  obtained by removing a portion of the hard nail itself from each of the involved toenails using a sterile nail nipper and sent to [facility] lab. Patient tolerated the biopsy procedure well without discomfort or need for anesthesia.  -Discussed fungal nail treatment options including oral, topical, and laser treatments.  -Discussed and educated patient on diabetic foot care, especially with  regards to the vascular, neurological and musculoskeletal systems.  -Stressed the importance of good glycemic control and the detriment of not  controlling glucose levels in relation to the foot. -Patient to return in 4 weeks for follow up evaluation and discussion of fungal culture results or sooner if symptoms worsen.   Lorenda Peck, DPM

## 2022-07-01 ENCOUNTER — Ambulatory Visit: Payer: BC Managed Care – PPO | Admitting: Podiatry

## 2022-07-29 ENCOUNTER — Other Ambulatory Visit: Payer: Self-pay | Admitting: Osteopathic Medicine

## 2022-08-05 DIAGNOSIS — R0789 Other chest pain: Secondary | ICD-10-CM | POA: Diagnosis not present

## 2022-08-05 DIAGNOSIS — E785 Hyperlipidemia, unspecified: Secondary | ICD-10-CM | POA: Diagnosis not present

## 2022-08-05 DIAGNOSIS — F32A Depression, unspecified: Secondary | ICD-10-CM | POA: Insufficient documentation

## 2022-08-05 DIAGNOSIS — I2510 Atherosclerotic heart disease of native coronary artery without angina pectoris: Secondary | ICD-10-CM | POA: Diagnosis not present

## 2022-08-05 DIAGNOSIS — F419 Anxiety disorder, unspecified: Secondary | ICD-10-CM | POA: Diagnosis not present

## 2022-08-05 DIAGNOSIS — I259 Chronic ischemic heart disease, unspecified: Secondary | ICD-10-CM | POA: Diagnosis not present

## 2022-08-08 DIAGNOSIS — R079 Chest pain, unspecified: Secondary | ICD-10-CM | POA: Diagnosis not present

## 2022-08-13 ENCOUNTER — Other Ambulatory Visit: Payer: Self-pay

## 2022-08-13 MED ORDER — ESCITALOPRAM OXALATE 5 MG PO TABS: 5.0000 mg | ORAL_TABLET | Freq: Every day | ORAL | 0 refills | Status: DC

## 2022-12-14 ENCOUNTER — Other Ambulatory Visit: Payer: Self-pay | Admitting: Family Medicine

## 2023-02-24 DIAGNOSIS — E785 Hyperlipidemia, unspecified: Secondary | ICD-10-CM | POA: Diagnosis not present

## 2023-03-03 ENCOUNTER — Ambulatory Visit: Payer: BC Managed Care – PPO | Admitting: Family Medicine

## 2023-03-03 ENCOUNTER — Encounter: Payer: Self-pay | Admitting: Family Medicine

## 2023-03-03 VITALS — BP 145/68 | HR 74 | Temp 97.9°F | Resp 18 | Ht 67.0 in | Wt 169.1 lb

## 2023-03-03 DIAGNOSIS — B351 Tinea unguium: Secondary | ICD-10-CM

## 2023-03-03 DIAGNOSIS — F411 Generalized anxiety disorder: Secondary | ICD-10-CM | POA: Diagnosis not present

## 2023-03-03 DIAGNOSIS — Z5181 Encounter for therapeutic drug level monitoring: Secondary | ICD-10-CM | POA: Diagnosis not present

## 2023-03-03 MED ORDER — TERBINAFINE HCL 250 MG PO TABS
250.0000 mg | ORAL_TABLET | Freq: Every day | ORAL | 1 refills | Status: AC
Start: 2023-03-03 — End: ?

## 2023-03-03 MED ORDER — ESCITALOPRAM OXALATE 20 MG PO TABS: 20.0000 mg | ORAL_TABLET | Freq: Every day | ORAL | 0 refills | Status: DC

## 2023-03-03 NOTE — Progress Notes (Signed)
Established Patient Office Visit  Subjective   Patient ID: Zachary Harvey, male    DOB: 1960/12/28  Age: 62 y.o. MRN: [id number]  Chief Complaint  Patient presents with   Medical Management of Chronic Issues    Patient is here for medication refills, he would also like to discuss fungus on both feet, he states that he was sent to the podiatrist but they did not treat the issue, he also wants to discuss the pain in his ribs and due to a triple by pass 3 years ago and an increased dosage for a  prescription for Escitalopram     HPI  Mood Pt is here for medication refills. He was on Lexapro 5 mg and needs this refilled. He reports his wife was on 20 mg and he was using her medicines. He cut her medicine in half and has been taking 10mg  daily. He feels like the 10 mg doesn't help. He would like to try a higher dose.  Fungal infection Pt has had fungal infection of his bilateral great toes. He was referred to podiatry. He had toenail culture and never followed up with them. This was in January 2024. This was reviewed today with the patient.   Review of Systems  Skin:        Black toenails  Psychiatric/Behavioral:  The patient is nervous/anxious.   All other systems reviewed and are negative.    Objective:     BP (!) 145/68   Pulse 74   Temp 97.9 F (36.6 C) (Oral)   Resp 18   Ht 5\' 7"  (1.702 m)   Wt 169 lb 1.6 oz (76.7 kg)   SpO2 98%   BMI 26.48 kg/m  BP Readings from Last 3 Encounters:  03/03/23 (!) 145/68  05/27/22 108/71  05/20/22 124/83      Physical Exam Vitals and nursing note reviewed.  Constitutional:      Appearance: Normal appearance. He is normal weight.  HENT:     Head: Normocephalic and atraumatic.     Right Ear: External ear normal.     Left Ear: External ear normal.     Nose: Nose normal.     Mouth/Throat:     Mouth: Mucous membranes are moist.     Pharynx: Oropharynx is clear.  Eyes:     Conjunctiva/sclera: Conjunctivae normal.     Pupils:  Pupils are equal, round, and reactive to light.  Cardiovascular:     Rate and Rhythm: Normal rate.  Pulmonary:     Effort: Pulmonary effort is normal.  Abdominal:     General: Abdomen is flat. Bowel sounds are normal.  Skin:    General: Skin is warm.     Capillary Refill: Capillary refill takes less than 2 seconds.     Comments: Blackened bilateral great toe nails  Neurological:     General: No focal deficit present.     Mental Status: He is alert and oriented to person, place, and time. Mental status is at baseline.  Psychiatric:        Mood and Affect: Mood normal.        Behavior: Behavior normal.        Thought Content: Thought content normal.        Judgment: Judgment normal.    No results found for any visits on 03/03/23.     The ASCVD Risk score (Arnett DK, et al., 2019) failed to calculate for the following reasons:   The patient has a  prior MI or stroke diagnosis    Assessment & Plan:   Problem List Items Addressed This Visit   None  GAD (generalized anxiety disorder) -     Escitalopram Oxalate; Take 1 tablet (20 mg total) by mouth daily.  Dispense: 90 tablet; Refill: 0  Onychomycosis -     Terbinafine HCl; Take 1 tablet (250 mg total) by mouth daily.  Dispense: 30 tablet; Refill: 1 -     Comprehensive metabolic panel  Medication monitoring encounter -     Comprehensive metabolic panel   Worsening anxiety. Pt needs refills on his Lexapro. He was on 5mg  but was using 20mg  1/2 tab of his wife's lexapro. Still not helping. He would like to try increased dose of the Lexapro. Will send in 20mg  daily and to follow up.  Reviewed culture with pt and confirmed fungal infection. He never received treatment for this. Will send in Terbinafine 250mg  daily and do CMP baseline liver function labs.   No follow-ups on file.    Suzan Slick, MD

## 2023-03-04 LAB — COMPREHENSIVE METABOLIC PANEL
ALT: 33 [IU]/L (ref 0–44)
AST: 24 [IU]/L (ref 0–40)
Albumin: 4.4 g/dL (ref 3.8–4.9)
Alkaline Phosphatase: 114 [IU]/L (ref 44–121)
BUN/Creatinine Ratio: 27 — ABNORMAL HIGH (ref 10–24)
BUN: 23 mg/dL (ref 8–27)
Bilirubin Total: 0.5 mg/dL (ref 0.0–1.2)
CO2: 23 mmol/L (ref 20–29)
Calcium: 9.4 mg/dL (ref 8.6–10.2)
Chloride: 104 mmol/L (ref 96–106)
Creatinine, Ser: 0.85 mg/dL (ref 0.76–1.27)
Globulin, Total: 2.8 g/dL (ref 1.5–4.5)
Glucose: 93 mg/dL (ref 70–99)
Potassium: 4.2 mmol/L (ref 3.5–5.2)
Sodium: 141 mmol/L (ref 134–144)
Total Protein: 7.2 g/dL (ref 6.0–8.5)
eGFR: 99 mL/min/{1.73_m2} (ref >59)

## 2023-05-05 DIAGNOSIS — I25118 Atherosclerotic heart disease of native coronary artery with other forms of angina pectoris: Secondary | ICD-10-CM | POA: Diagnosis not present

## 2023-05-05 DIAGNOSIS — E782 Mixed hyperlipidemia: Secondary | ICD-10-CM | POA: Diagnosis not present

## 2023-05-05 DIAGNOSIS — R079 Chest pain, unspecified: Secondary | ICD-10-CM | POA: Diagnosis not present

## 2023-05-05 DIAGNOSIS — I2089 Other forms of angina pectoris: Secondary | ICD-10-CM | POA: Diagnosis not present

## 2023-05-05 DIAGNOSIS — I259 Chronic ischemic heart disease, unspecified: Secondary | ICD-10-CM | POA: Diagnosis not present

## 2023-05-05 DIAGNOSIS — G4733 Obstructive sleep apnea (adult) (pediatric): Secondary | ICD-10-CM | POA: Diagnosis not present

## 2023-05-05 DIAGNOSIS — I2510 Atherosclerotic heart disease of native coronary artery without angina pectoris: Secondary | ICD-10-CM | POA: Diagnosis not present

## 2023-05-05 DIAGNOSIS — E119 Type 2 diabetes mellitus without complications: Secondary | ICD-10-CM | POA: Diagnosis not present

## 2023-07-07 DIAGNOSIS — I2510 Atherosclerotic heart disease of native coronary artery without angina pectoris: Secondary | ICD-10-CM | POA: Diagnosis not present

## 2023-07-07 DIAGNOSIS — I2089 Other forms of angina pectoris: Secondary | ICD-10-CM | POA: Diagnosis not present

## 2023-07-07 DIAGNOSIS — E782 Mixed hyperlipidemia: Secondary | ICD-10-CM | POA: Diagnosis not present

## 2023-07-07 DIAGNOSIS — I25118 Atherosclerotic heart disease of native coronary artery with other forms of angina pectoris: Secondary | ICD-10-CM | POA: Diagnosis not present

## 2023-07-07 DIAGNOSIS — I259 Chronic ischemic heart disease, unspecified: Secondary | ICD-10-CM | POA: Diagnosis not present

## 2023-07-07 DIAGNOSIS — E119 Type 2 diabetes mellitus without complications: Secondary | ICD-10-CM | POA: Diagnosis not present

## 2023-07-07 DIAGNOSIS — E785 Hyperlipidemia, unspecified: Secondary | ICD-10-CM | POA: Diagnosis not present

## 2023-07-07 LAB — HEMOGLOBIN A1C: Hemoglobin A1C: 6

## 2023-08-31 ENCOUNTER — Other Ambulatory Visit: Payer: Self-pay | Admitting: Family Medicine

## 2023-10-07 ENCOUNTER — Other Ambulatory Visit: Payer: Self-pay | Admitting: Family Medicine

## 2023-10-13 ENCOUNTER — Other Ambulatory Visit: Payer: Self-pay | Admitting: Family Medicine

## 2023-10-13 ENCOUNTER — Telehealth: Payer: Self-pay

## 2023-10-13 NOTE — Telephone Encounter (Signed)
 Called patient no answer, no VM to leave message . Will try again  Copied from CRM [id number]. Topic: Clinical - Medication Question >> Oct 13, 2023 11:25 AM Antwanette L wrote: Reason for CRM: Diamond a Associate Professor from [employer] is calling to refill escitalopram  (LEXAPRO ) 20 MG tablet. The patient is out of the medication. Patient can be contacted at [phone]   [facility] [address] [place] [address] Phone: [phone] Fax: [phone]

## 2023-10-13 NOTE — Telephone Encounter (Signed)
 Spoke with patient and informed him that he would need an appointment for further refills. 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 I aksed would he like to make an appointment for Monday , he refused and was upset that out office was not open on Saturdays. He said that prior facilities he has gone to was open , I apologized and I explained to him that we close for the weekend patient was not happy and stated that he would go somewhere else.  Copied from CRM [id number]. Topic: General - Other >> Oct 13, 2023  1:27 PM Rachelle R wrote: Reason for CRM: Patient missed a call from [facility] in regards to his medication refill request. Would like a call back.  Patient can't be reached at [phone]

## 2023-10-13 NOTE — Telephone Encounter (Signed)
 Noted and aware. Pt has been seen by cardiology clinic in March. If Monday's work for him, I have availability. Last OV with me was in November 2024 and past due for follow up. No further refills will be given until appt is made.

## 2023-10-20 ENCOUNTER — Encounter: Payer: Self-pay | Admitting: Family Medicine

## 2023-10-20 ENCOUNTER — Ambulatory Visit: Admitting: Family Medicine

## 2023-10-20 VITALS — BP 97/62 | HR 68 | Temp 98.1°F | Resp 18 | Ht 67.0 in | Wt 163.9 lb

## 2023-10-20 DIAGNOSIS — F411 Generalized anxiety disorder: Secondary | ICD-10-CM

## 2023-10-20 DIAGNOSIS — E1165 Type 2 diabetes mellitus with hyperglycemia: Secondary | ICD-10-CM | POA: Diagnosis not present

## 2023-10-20 DIAGNOSIS — Z7984 Long term (current) use of oral hypoglycemic drugs: Secondary | ICD-10-CM

## 2023-10-20 DIAGNOSIS — B351 Tinea unguium: Secondary | ICD-10-CM

## 2023-10-20 MED ORDER — ESCITALOPRAM OXALATE 20 MG PO TABS: 20.0000 mg | ORAL_TABLET | Freq: Every day | ORAL | 1 refills | Status: DC

## 2023-10-20 MED ORDER — DAPAGLIFLOZIN PROPANEDIOL 10 MG PO TABS: 10.0000 mg | ORAL_TABLET | Freq: Every day | ORAL | 5 refills | Status: DC

## 2023-10-20 MED ORDER — OZEMPIC (0.25 OR 0.5 MG/DOSE) 2 MG/3ML ~~LOC~~ SOPN: 0.5000 mg | PEN_INJECTOR | SUBCUTANEOUS | 5 refills | Status: AC

## 2023-10-20 MED ORDER — CICLOPIROX 8 % EX SOLN
Freq: Every day | CUTANEOUS | 3 refills | Status: AC
Start: 2023-10-20 — End: ?

## 2023-10-20 NOTE — Progress Notes (Signed)
 Established Patient Office Visit  Subjective   Patient ID: Zachary Harvey, male    DOB: 1960-09-05  Age: 63 y.o. MRN: [id number]  Chief Complaint  Patient presents with   Medication Refill    Medication Refill    GAD Pt taking Lexapro  20mg  daily. Doing well with this. Needs this refilled.  Flowsheet Row Office Visit from 10/20/2023 in [facility]  PHQ-9 Total Score 0       10/20/2023   10:39 AM 05/20/2022    9:56 AM 04/19/2020   10:11 AM 04/26/2019    3:23 PM  GAD 7 : Generalized Anxiety Score  Nervous, Anxious, on Edge 0 0 0 0  Control/stop worrying 0 0 0 0  Worry too much - different things 0 0 0 0  Trouble relaxing 0 0 0 0  Restless 0 0 0 0  Easily annoyed or irritable 0 0 0 0  Afraid - awful might happen 0 0 0 0  Total GAD 7 Score 0 0 0 0  Anxiety Difficulty Not difficult at all Not difficult at all Not difficult at all Not difficult at all      Diabetes He has been on Farxiga  10mg  and Ozempic 0.5 mg weekly. DM controlled and last A1c 6.0 in March 2025. Needs this refilled today.  Onychomycosis Pt has been dealing with this since last year. Still treating at home with clipping and cutting nails. Seen podiatrist last year and done fungal culture. He was given terbinafine  for 2 months. This hasn't helped.   Review of Systems  Skin:        Nail fungus  All other systems reviewed and are negative.     Objective:     BP 97/62   Pulse 68   Temp 98.1 F (36.7 C) (Oral)   Resp 18   Ht 5' 7 (1.702 m)   Wt 163 lb 14.4 oz (74.3 kg)   SpO2 98%   BMI 25.67 kg/m  BP Readings from Last 3 Encounters:  10/20/23 97/62  03/03/23 (!) 145/68  05/27/22 108/71      Physical Exam Vitals and nursing note reviewed.  Constitutional:      Appearance: Normal appearance. He is normal weight.  HENT:     Head: Normocephalic and atraumatic.     Right Ear: External ear normal.     Left Ear: External ear normal.     Nose: Nose normal.      Mouth/Throat:     Mouth: Mucous membranes are moist.     Pharynx: Oropharynx is clear.   Eyes:     Conjunctiva/sclera: Conjunctivae normal.     Pupils: Pupils are equal, round, and reactive to light.    Cardiovascular:     Rate and Rhythm: Normal rate and regular rhythm.     Pulses: Normal pulses.     Heart sounds: Normal heart sounds.  Pulmonary:     Effort: Pulmonary effort is normal.     Breath sounds: Normal breath sounds.  Abdominal:     General: Abdomen is flat. Bowel sounds are normal.   Skin:    General: Skin is warm.     Capillary Refill: Capillary refill takes less than 2 seconds.     Comments: Bilateral great toe fungal infection and nail discoloration   Neurological:     General: No focal deficit present.     Mental Status: He is alert and oriented to person, place, and time. Mental status  is at baseline.   Psychiatric:        Mood and Affect: Mood normal.        Behavior: Behavior normal.        Thought Content: Thought content normal.        Judgment: Judgment normal.      No results found for any visits on 10/20/23.  Last hemoglobin A1c Lab Results  Component Value Date   HGBA1C 6.0 07/07/2023      The ASCVD Risk score (Arnett DK, et al., 2019) failed to calculate for the following reasons:   Risk score cannot be calculated because patient has a medical history suggesting prior/existing ASCVD    Assessment & Plan:   Problem List Items Addressed This Visit   None GAD (generalized anxiety disorder) -     Escitalopram  Oxalate; Take 1 tablet (20 mg total) by mouth daily.  Dispense: 90 tablet; Refill: 1  Uncontrolled type 2 diabetes mellitus with hyperglycemia (HCC) -     Ozempic (0.25 or 0.5 MG/DOSE); Inject 0.5 mg into the skin once a week.  Dispense: 3 mL; Refill: 5 -     Dapagliflozin  Propanediol; Take 1 tablet (10 mg total) by mouth daily.  Dispense: 30 tablet; Refill: 5  Onychomycosis -     Ciclopirox; Apply topically at bedtime.  Apply over nail and surrounding skin. Apply daily over previous coat. After seven (7) days, may remove with alcohol and continue cycle.  Dispense: 6.6 mL; Refill: 3    Pt with GAD. Controlled on Lexapro  20mg  daily. Refilled today. Pt with DM controlled. Last A1c stable. Refilled farxiga  10mg  and Ozempic 0.5 mg weekly.  Pt with continued onychomycosis. Sent in Penlac polish to use and discussed home care instructions.  No follow-ups on file.    Torrence CINDERELLA Barrier, MD

## 2024-02-09 DIAGNOSIS — I259 Chronic ischemic heart disease, unspecified: Secondary | ICD-10-CM | POA: Diagnosis not present

## 2024-02-09 DIAGNOSIS — E782 Mixed hyperlipidemia: Secondary | ICD-10-CM | POA: Diagnosis not present

## 2024-02-09 DIAGNOSIS — R0789 Other chest pain: Secondary | ICD-10-CM | POA: Diagnosis not present

## 2024-02-09 DIAGNOSIS — R079 Chest pain, unspecified: Secondary | ICD-10-CM | POA: Diagnosis not present

## 2024-02-09 DIAGNOSIS — E785 Hyperlipidemia, unspecified: Secondary | ICD-10-CM | POA: Diagnosis not present

## 2024-02-09 DIAGNOSIS — E1169 Type 2 diabetes mellitus with other specified complication: Secondary | ICD-10-CM | POA: Diagnosis not present

## 2024-04-19 ENCOUNTER — Other Ambulatory Visit: Payer: Self-pay | Admitting: Family Medicine

## 2024-04-19 DIAGNOSIS — F411 Generalized anxiety disorder: Secondary | ICD-10-CM

## 2024-04-21 ENCOUNTER — Other Ambulatory Visit: Payer: Self-pay | Admitting: Family Medicine

## 2024-04-21 DIAGNOSIS — F411 Generalized anxiety disorder: Secondary | ICD-10-CM

## 2024-05-26 ENCOUNTER — Other Ambulatory Visit: Payer: Self-pay | Admitting: Family Medicine

## 2024-05-26 DIAGNOSIS — E1165 Type 2 diabetes mellitus with hyperglycemia: Secondary | ICD-10-CM
